# Patient Record
Sex: Female | Born: 1965 | Race: Black or African American | Hispanic: No | Marital: Married | Smoking: Never smoker
Health system: Southern US, Community
[De-identification: ages and names within clinical notes are randomized; demographics above are authoritative.]

## PROBLEM LIST (undated history)

## (undated) DIAGNOSIS — J40 Bronchitis, not specified as acute or chronic: Secondary | ICD-10-CM

## (undated) DIAGNOSIS — J45909 Unspecified asthma, uncomplicated: Secondary | ICD-10-CM

## (undated) DIAGNOSIS — R600 Localized edema: Secondary | ICD-10-CM

## (undated) DIAGNOSIS — E669 Obesity, unspecified: Secondary | ICD-10-CM

## (undated) DIAGNOSIS — E7800 Pure hypercholesterolemia, unspecified: Secondary | ICD-10-CM

## (undated) HISTORY — DX: Unspecified asthma, uncomplicated: J45.909

## (undated) HISTORY — DX: Bronchitis, not specified as acute or chronic: J40

## (undated) HISTORY — DX: Localized edema: R60.0

## (undated) HISTORY — DX: Pure hypercholesterolemia, unspecified: E78.00

## (undated) HISTORY — PX: TUBAL LIGATION: SHX77

---

## 2007-03-08 ENCOUNTER — Ambulatory Visit (HOSPITAL_COMMUNITY): Admission: RE | Admit: 2007-03-08 | Discharge: 2007-03-08 | Payer: Self-pay | Admitting: Gastroenterology

## 2010-07-02 NOTE — Op Note (Signed)
Laura Ryan, Laura Ryan                ACCOUNT NO.:  [id number]   MEDICAL RECORD NO.:  [id number]          PATIENT TYPE:  AMB   LOCATION:  ENDO                         FACILITY:  [facility]   PHYSICIAN:  Anselmo Rod, M.D.  DATE OF BIRTH:  [birth year]   DATE OF PROCEDURE:  03/08/2007  DATE OF DISCHARGE:                               OPERATIVE REPORT   PROCEDURE:  Screening colonoscopy.   ENDOSCOPIST:  Anselmo Rod, M.D.   INSTRUMENT USED:  Pentax video colonoscopy.   INDICATIONS FOR PROCEDURE:  This 45 year old African/American female  with a family history of colon cancer in her mother who died at age 60.  The patient is undergoing a screening colonoscopy to rule out colonic  polyps, masses, etc.   PRE-PROCEDURE PREPARATION:  An informed consent was procured from the  patient.  The patient was fasted for eight hours prior to the procedure  and prepped with a bottle of magnesium citrate and one gallon of  Nulytely on the night prior to the procedure.  The risks and benefits of  the procedure, including a 10% mis-rate of cancer and polyps were  discussed with the patient as well.   PRE-PROCEDURE PHYSICAL EXAMINATION:  VITAL SIGNS:  Stable.  NECK:  Supple.  CHEST:  Clear to auscultation.  HEART:  S1 and S2 regular.  ABDOMEN:  Soft, normal bowel sounds.   DESCRIPTION OF PROCEDURE:  The patient was placed in the left lateral  decubitus position.  Sedated with 100 mcg of Fentanyl and 8.5 mg of  Versed given intravenously in slow incremental doses.  Once the patient  was adequately sedated and maintained on low-flow oxygen and continuous  cardiac monitoring, the Pentax video colonoscope was advanced from the  rectum to the cecum.  The appendiceal orifice and ileocecal valve were  clearly visualized and photographed.  No masses, polyps, erosions,  ulcerations or diverticula were seen.  Retroflexion in the rectum  revealed no abnormalities.  The terminal ileum appeared healthy and  without lesions.   The patient tolerated the procedure well without immediate  complications.   IMPRESSION:  Normal colonoscopy up to the terminal ileum.  No masses,  polyps, erosions, ulcerations or diverticula were noted.   RECOMMENDATIONS:  1. Continue a high fiber diet with liberal fluid intake.  2. Repeat colonoscopy in the next five years, unless the patient has      any abnormal symptoms in the interim, in which case she should      contact the office for further recommendations.  3. Outpatient followup as needs arise in the future.      Anselmo Rod, M.D.  Electronically Signed     JNM/MEDQ  D:  03/08/2007  T:  03/08/2007  Job:  [id number]   cc:   Ernestina Penna, M.D.  Fax: [phone]

## 2013-03-07 ENCOUNTER — Ambulatory Visit (INDEPENDENT_AMBULATORY_CARE_PROVIDER_SITE_OTHER): Admitting: Physician Assistant

## 2013-03-07 ENCOUNTER — Encounter: Payer: Self-pay | Admitting: Physician Assistant

## 2013-03-07 VITALS — BP 136/80 | HR 68 | Temp 98.6°F | Resp 18 | Ht 63.0 in | Wt 217.0 lb

## 2013-03-07 DIAGNOSIS — Z111 Encounter for screening for respiratory tuberculosis: Secondary | ICD-10-CM

## 2013-03-07 DIAGNOSIS — Z23 Encounter for immunization: Secondary | ICD-10-CM

## 2013-03-07 DIAGNOSIS — E559 Vitamin D deficiency, unspecified: Secondary | ICD-10-CM

## 2013-03-07 DIAGNOSIS — Z0000 Encounter for general adult medical examination without abnormal findings: Secondary | ICD-10-CM

## 2013-03-07 DIAGNOSIS — Z0184 Encounter for antibody response examination: Secondary | ICD-10-CM

## 2013-03-07 NOTE — Progress Notes (Signed)
Patient ID: Laura Ryan MRN: [id number], DOB: 1965-07-18, 48 y.o. Date of Encounter: 03/07/2013,   Chief Complaint: Physical (CPE)  HPI: 48 y.o. y/o AA  female  here for CPE.   She has performed at least to be completed for her school. 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 She is retired from the [employer]. She is working part-time at [employer] as well as going to school. She has no complaints today.   Review of Systems: Consitutional: No fever, chills, fatigue, night sweats, lymphadenopathy. No significant/unexplained weight changes. Eyes: No visual changes, eye redness, or discharge. ENT/Mouth: No ear pain, sore throat, nasal drainage, or sinus pain. Cardiovascular: No chest pressure,heaviness, tightness or squeezing, even with exertion. No increased shortness of breath or dyspnea on exertion.No palpitations, edema, orthopnea, PND. Respiratory: No cough, hemoptysis, SOB, or wheezing. Gastrointestinal: No anorexia, dysphagia, reflux, pain, nausea, vomiting, hematemesis, diarrhea, constipation, BRBPR, or melena. Breast: No mass, nodules, bulging, or retraction. No skin changes or inflammation. No nipple discharge. No lymphadenopathy. Genitourinary: No dysuria, hematuria, incontinence, vaginal discharge, pruritis, burning, abnormal bleeding, or pain. Musculoskeletal: No decreased ROM, No joint pain or swelling. No significant pain in neck, back, or extremities. Skin: No rash, pruritis, or concerning lesions. Neurological: No headache, dizziness, syncope, seizures, tremors, memory loss, coordination problems, or paresthesias. Psychological: No anxiety, depression, hallucinations, SI/HI. Endocrine: No polydipsia, polyphagia, polyuria, or known diabetes.No increased fatigue. No palpitations/rapid heart rate. No significant/unexplained weight change. All other systems were reviewed and are otherwise negative.  History reviewed. No pertinent past medical history.    History reviewed. No pertinent past surgical history.  Home Meds:  No current outpatient prescriptions on file prior to visit.   No current facility-administered medications on file prior to visit.    Allergies: No Known Allergies  History   Social History  . Marital Status: Married    Spouse Name: N/A    Number of Children: N/A  . Years of Education: N/A   Occupational History  . Not on file.   Social History Main Topics  . Smoking status: Never Smoker   . Smokeless tobacco: Never Used  . Alcohol Use: No  . Drug Use: No  . Sexual Activity: Not on file   Other Topics Concern  . Not on file   Social History Narrative  . No narrative on file   She is married with 3 children. However they're all out of the house. He says that 2 of the children are in college. And one of them is trying to "find himself ". She is retired from the [employer]. She works part-time at [employer]. 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  Family history he was entered into the computer system at the start of my note. Is significant for her mother being diagnosed with colon cancer at age 29 and dying at age 38.  Physical Exam: Blood pressure 136/80, pulse 68, temperature 98.6 F (37 C), temperature source Oral, resp. rate 18, height 5\' 3"  (1.6 m), weight 217 lb (98.431 kg), last menstrual period 02/03/2013., Body mass index is 38.45 kg/(m^2). General: Obese AAF. Appears in no acute distress. HEENT: Normocephalic, atraumatic. Conjunctiva pink, sclera non-icteric. Pupils 2 mm constricting to 1 mm, round, regular, and equally reactive to light and accomodation. EOMI. Internal auditory canal clear. TMs with good cone of light and without pathology. Nasal mucosa pink. Nares are without discharge. No sinus tenderness. Oral mucosa pink.  Pharynx without exudate.  Neck: Supple. Trachea midline. No thyromegaly. Full ROM. No lymphadenopathy.No Carotid Bruits. Lungs: Clear to auscultation  bilaterally without wheezes, rales, or rhonchi. Breathing is of normal effort and unlabored. Cardiovascular: RRR with S1 S2. No murmurs, rubs, or gallops. Distal pulses 2+ symmetrically. No carotid or abdominal bruits. Breast: Symmetrical. No masses. Nipples without discharge. Abdomen: Soft, non-tender, non-distended with normoactive bowel sounds. No hepatosplenomegaly or masses. No rebound/guarding. No CVA tenderness. No hernias.  Genitourinary:  External genitalia without lesions. Vaginal mucosa pink.No discharge present. Cervix pink and without discharge. No cervical tenderness.Normal uterus size. No adnexal mass or tenderness.   Musculoskeletal: Full range of motion and 5/5 strength throughout. Without swelling, atrophy, tenderness, crepitus, or warmth. Extremities without clubbing, cyanosis, or edema. Calves supple. Skin: Warm and moist without erythema, ecchymosis, wounds, or rash. Neuro: A+Ox3. CN II-XII grossly intact. Moves all extremities spontaneously. Full sensation throughout. Normal gait. DTR 2+ throughout upper and lower extremities. Finger to nose intact. Psych:  Responds to questions appropriately with a normal affect.   Assessment/Plan:  48 y.o. y/o female here for CPE 1. Visit for preventive health examination  A. Screening Labs: She is not fasting. She had a full set of labs done with her physical 08/31/09. All labs were normal except for low vitamin D. She is having no symptoms to suggest new lab abnormality. We'll wait to repeat full set of labs.  B. Pap: Last Pap was 08/31/2009. Normal. Can wait 5 years to repeat this will be due 08/2014. Pelvic exam is normal.  C. Screening Mammogram: Last mammogram report on file is 09/26/2009. Patient states that this is the last mammogram she has had. It was negative. Today I did write down the name of the mammogram facility as well as the phone number and she is to go home and call to schedule followup mammogram.  D. DEXA/BMD: Can  wait to schedule this in the future.  E. Colorectal Cancer Screening: Premature family history of colon cancer. Her mother was diagnosed with colon cancer at age 48 and died at age 48. Strongly encouraged her to schedule followup for her colonoscopy immediately.  Last colonoscopy was January 2009. Repeat 5 years. I discussed with her that this is overdue. On her AVS today, I have written down Dr. Kenna GilbertMann's phone number and she is to call Dr. Kenna GilbertMann's office to schedule followup.  F. Immunizations:  Influenza: Influenza vaccine given today Tetanus: Tdap Given today Pneumococcal: Not indicated until age 48 Zostavax: We'll discuss at age 48   2. Vitamin D deficiency At her last lab July 2011 she had vitamin D deficiency. She reports that she did take that prescription vitamin D weekly for 3 months. However, when she completed the prescription she did not take any over-the-counter vitamin D. I recommended she start taking over-the-counter vitamin D 2000 units daily.  3. PPD screening test--needed for her form for her schooling. - PPD  4. Need for prophylactic vaccination and inoculation against influenza - Flu Vaccine QUAD 36+ mos PF IM (Fluarix)  5. Need for Tdap vaccination - Tdap vaccine greater than or equal to 7yo IM  6. Antibody response examination Varicella titer as needed for her form for school. - Varicella zoster antibody, IgG  Signed, [address]Mary Beth Livingston ManorDixon, [place], [facility] 03/07/2013 1:34 PM

## 2013-03-08 LAB — VARICELLA ZOSTER ANTIBODY, IGG: VARICELLA IGG: 645.7 {index} — AB (ref <135.00)

## 2013-03-09 ENCOUNTER — Ambulatory Visit: Admitting: Family Medicine

## 2013-03-09 DIAGNOSIS — Z111 Encounter for screening for respiratory tuberculosis: Secondary | ICD-10-CM

## 2013-03-09 LAB — TB SKIN TEST
INDURATION: 0 mm
TB Skin Test: NEGATIVE

## 2013-03-09 NOTE — Progress Notes (Signed)
Patient ID: Laura RummageSheila Ryan, female   DOB: 01/24/1966, 48 y.o.   MRN: [id number] Pt had PPD TB skin test applied on Monday.  Returns today for reading.  Site is clear of any reaction.

## 2013-03-15 ENCOUNTER — Ambulatory Visit (INDEPENDENT_AMBULATORY_CARE_PROVIDER_SITE_OTHER): Admitting: Family Medicine

## 2013-03-15 DIAGNOSIS — Z111 Encounter for screening for respiratory tuberculosis: Secondary | ICD-10-CM

## 2013-03-17 ENCOUNTER — Ambulatory Visit: Admitting: *Deleted

## 2013-03-17 DIAGNOSIS — Z111 Encounter for screening for respiratory tuberculosis: Secondary | ICD-10-CM

## 2013-03-17 LAB — TB SKIN TEST
Induration: 0 mm
TB SKIN TEST: NEGATIVE

## 2013-04-04 ENCOUNTER — Encounter: Payer: Self-pay | Admitting: Family Medicine

## 2014-01-27 ENCOUNTER — Emergency Department (HOSPITAL_COMMUNITY)
Admission: EM | Admit: 2014-01-27 | Discharge: 2014-01-28 | Disposition: A | Discharge status: Home / Self Care | Attending: Emergency Medicine | Admitting: Emergency Medicine

## 2014-01-27 ENCOUNTER — Encounter (HOSPITAL_COMMUNITY): Payer: Self-pay | Admitting: Emergency Medicine

## 2014-01-27 DIAGNOSIS — S39012A Strain of muscle, fascia and tendon of lower back, initial encounter: Secondary | ICD-10-CM

## 2014-01-27 DIAGNOSIS — Z3202 Encounter for pregnancy test, result negative: Secondary | ICD-10-CM | POA: Insufficient documentation

## 2014-01-27 DIAGNOSIS — E669 Obesity, unspecified: Secondary | ICD-10-CM | POA: Diagnosis not present

## 2014-01-27 DIAGNOSIS — X58XXXA Exposure to other specified factors, initial encounter: Secondary | ICD-10-CM | POA: Insufficient documentation

## 2014-01-27 DIAGNOSIS — Y998 Other external cause status: Secondary | ICD-10-CM | POA: Insufficient documentation

## 2014-01-27 DIAGNOSIS — Y93E5 Activity, floor mopping and cleaning: Secondary | ICD-10-CM | POA: Diagnosis not present

## 2014-01-27 DIAGNOSIS — Z79899 Other long term (current) drug therapy: Secondary | ICD-10-CM | POA: Insufficient documentation

## 2014-01-27 DIAGNOSIS — Y92019 Unspecified place in single-family (private) house as the place of occurrence of the external cause: Secondary | ICD-10-CM | POA: Insufficient documentation

## 2014-01-27 DIAGNOSIS — R55 Syncope and collapse: Secondary | ICD-10-CM | POA: Insufficient documentation

## 2014-01-27 HISTORY — DX: Obesity, unspecified: E66.9

## 2014-01-27 LAB — BASIC METABOLIC PANEL
Anion gap: 14 (ref 5–15)
BUN: 13 mg/dL (ref 6–23)
CALCIUM: 9.4 mg/dL (ref 8.4–10.5)
CO2: 27 meq/L (ref 19–32)
CREATININE: 1.04 mg/dL (ref 0.50–1.10)
Chloride: 98 mEq/L (ref 96–112)
GFR calc Af Amer: 72 mL/min — ABNORMAL LOW (ref >90)
GFR, EST NON AFRICAN AMERICAN: 62 mL/min — AB (ref >90)
GLUCOSE: 95 mg/dL (ref 70–99)
Potassium: 4.2 mEq/L (ref 3.7–5.3)
Sodium: 139 mEq/L (ref 137–147)

## 2014-01-27 LAB — CBC
HCT: 33.6 % — ABNORMAL LOW (ref 36.0–46.0)
Hemoglobin: 11.3 g/dL — ABNORMAL LOW (ref 12.0–15.0)
MCH: 30.1 pg (ref 26.0–34.0)
MCHC: 33.6 g/dL (ref 30.0–36.0)
MCV: 89.4 fL (ref 78.0–100.0)
Platelets: 220 10*3/uL (ref 150–400)
RBC: 3.76 MIL/uL — ABNORMAL LOW (ref 3.87–5.11)
RDW: 13.7 % (ref 11.5–15.5)
WBC: 8.7 10*3/uL (ref 4.0–10.5)

## 2014-01-27 NOTE — ED Notes (Signed)
Pt reports that she was having lower back pain radiating to her L leg last night, became unable to walk on leg d/t pain. Pt felt the urge to urinate at 0330 this morning and stood up to go to the BR and "then I came to on the floor and had urinated on myself." Pt reports taking Phentermine for weight loss, felt flushed and sweaty before LOC. Denies any CP or ShOB. Pt NSR on monitor at this time. 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

## 2014-01-28 LAB — URINALYSIS, ROUTINE W REFLEX MICROSCOPIC
Bilirubin Urine: NEGATIVE
GLUCOSE, UA: NEGATIVE mg/dL
HGB URINE DIPSTICK: NEGATIVE
Ketones, ur: NEGATIVE mg/dL
Leukocytes, UA: NEGATIVE
Nitrite: NEGATIVE
Protein, ur: NEGATIVE mg/dL
Specific Gravity, Urine: 1.025 (ref 1.005–1.030)
Urobilinogen, UA: 0.2 mg/dL (ref 0.0–1.0)
pH: 5.5 (ref 5.0–8.0)

## 2014-01-28 LAB — TROPONIN I: Troponin I: <0.3 ng/mL (ref <0.30)

## 2014-01-28 LAB — CBG MONITORING, ED: Glucose-Capillary: 94 mg/dL (ref 70–99)

## 2014-01-28 LAB — PREGNANCY, URINE: PREG TEST UR: NEGATIVE

## 2014-01-28 MED ORDER — IBUPROFEN 600 MG PO TABS: 600.0000 mg | ORAL_TABLET | Freq: Four times a day (QID) | ORAL | Status: DC | PRN

## 2014-01-28 MED ORDER — METHOCARBAMOL 500 MG PO TABS: 500.0000 mg | ORAL_TABLET | Freq: Two times a day (BID) | ORAL | Status: DC

## 2014-01-28 NOTE — Discharge Instructions (Signed)
Discontinue taking diet supplements. Recommend ibuprofen and Robaxin as prescribed for back pain. Follow-up with your primary care doctor regarding your visit to the emergency department today. Return as needed if symptoms worsen.  Vasovagal Syncope, Adult Syncope, commonly known as fainting, is a temporary loss of consciousness. It occurs when the blood flow to the brain is reduced. Vasovagal syncope (also called neurocardiogenic syncope) is a fainting spell in which the blood flow to the brain is reduced because of a sudden drop in heart rate and blood pressure. Vasovagal syncope occurs when the brain and the cardiovascular system (blood vessels) do not adequately communicate and respond to each other. This is the most common cause of fainting. It often occurs in response to fear or some other type of emotional or physical stress. The body has a reaction in which the heart starts beating too slowly or the blood vessels expand, reducing blood pressure. This type of fainting spell is generally considered harmless. However, injuries can occur if a person takes a sudden fall during a fainting spell.  CAUSES  Vasovagal syncope occurs when a person's blood pressure and heart rate decrease suddenly, usually in response to a trigger. Many things and situations can trigger an episode. Some of these include:   Pain.   Fear.   The sight of blood or medical procedures, such as blood being drawn from a vein.   Common activities, such as coughing, swallowing, stretching, or going to the bathroom.   Emotional stress.   Prolonged standing, especially in a warm environment.   Lack of sleep or rest.   Prolonged lack of food.   Prolonged lack of fluids.   Recent illness.  The use of certain drugs that affect blood pressure, such as cocaine, alcohol, marijuana, inhalants, and opiates.  SYMPTOMS  Before the fainting episode, you may:   Feel dizzy or light headed.   Become pale.  Sense that  you are going to faint.   Feel like the room is spinning.   Have tunnel vision, only seeing directly in front of you.   Feel sick to your stomach (nauseous).   See spots or slowly lose vision.   Hear ringing in your ears.   Have a headache.   Feel warm and sweaty.   Feel a sensation of pins and needles. During the fainting spell, you will generally be unconscious for no longer than a couple minutes before waking up and returning to normal. If you get up too quickly before your body can recover, you may faint again. Some twitching or jerky movements may occur during the fainting spell.  DIAGNOSIS  Your caregiver will ask about your symptoms, take a medical history, and perform a physical exam. Various tests may be done to rule out other causes of fainting. These may include blood tests and tests to check the heart, such as electrocardiography, echocardiography, and possibly an electrophysiology study. When other causes have been ruled out, a test may be done to check the body's response to changes in position (tilt table test). TREATMENT  Most cases of vasovagal syncope do not require treatment. Your caregiver may recommend ways to avoid fainting triggers and may provide home strategies for preventing fainting. If you must be exposed to a possible trigger, you can drink additional fluids to help reduce your chances of having an episode of vasovagal syncope. If you have warning signs of an oncoming episode, you can respond by positioning yourself favorably (lying down). If your fainting spells continue, you may  be given medicines to prevent fainting. Some medicines may help make you more resistant to repeated episodes of vasovagal syncope. Special exercises or compression stockings may be recommended. In rare cases, the surgical placement of a pacemaker is considered. HOME CARE INSTRUCTIONS   Learn to identify the warning signs of vasovagal syncope.   Sit or lie down at the first  warning sign of a fainting spell. If sitting, put your head down between your legs. If you lie down, swing your legs up in the air to increase blood flow to the brain.   Avoid hot tubs and saunas.  Avoid prolonged standing.  Drink enough fluids to keep your urine clear or pale yellow. Avoid caffeine.  Increase salt in your diet as directed by your caregiver.   If you have to stand for a long time, perform movements such as:   Crossing your legs.   Flexing and stretching your leg muscles.   Squatting.   Moving your legs.   Bending over.   Only take over-the-counter or prescription medicines as directed by your caregiver. Do not suddenly stop any medicines without asking your caregiver first. SEEK MEDICAL CARE IF:   Your fainting spells continue or happen more frequently in spite of treatment.   You lose consciousness for more than a couple minutes.  You have fainting spells during or after exercising or after being startled.   You have new symptoms that occur with the fainting spells, such as:   Shortness of breath.  Chest pain.   Irregular heartbeat.   You have episodes of twitching or jerky movements that last longer than a few seconds.  You have episodes of twitching or jerky movements without obvious fainting. SEEK IMMEDIATE MEDICAL CARE IF:   You have injuries or bleeding after a fainting spell.   You have episodes of twitching or jerky movements that last longer than 5 minutes.   You have more than one spell of twitching or jerky movements before returning to consciousness after fainting. MAKE SURE YOU:   Understand these instructions.  Will watch your condition.  Will get help right away if you are not doing well or get worse. Document Released: 01/21/2012 Document Reviewed: 01/21/2012 [facility] Patient Information 2015 [place]. This information is not intended to replace advice given to you by your health care provider. Make sure  you discuss any questions you have with your health care provider.

## 2014-01-28 NOTE — ED Provider Notes (Signed)
CSN: [id number]     Arrival date & time 01/27/14  2200 History   First MD Initiated Contact with Patient 01/28/14 0055     Chief Complaint  Patient presents with  . Loss of Consciousness  . Back Pain     (Consider location/radiation/quality/duration/timing/severity/associated sxs/prior Treatment) HPI Comments: Patient is a 48 year old female with no significant past medical history who presents to the emergency department for further evaluation of syncope. Patient states that she was cleaning her house 2 days ago when she began to notice spasms in her low back. These spasms were intermittent and radiated down her left leg. Patient states that she took some Tylenol with some mild improvement to her symptoms. She states that she went about her day normally. She states that she woke up at 0300 yesterday morning secondary to an urge to urinate. Shortly after waking she began to feel lightheaded as well as dizzy and diaphoretic. She states that the next thing she knew, she was on the ground face forward. She had one episode of incontinence during this syncopal episode. Patient states that she has been feeling at baseline since this incident. She has had no complaints of headache, chest pain, shortness of breath, vomiting, diarrhea, fever, hemoptysis, extremity numbness/paresthesias, extremity weakness, genital numbness, or bowel/bladder incontinence. No associated leg swelling, recent surgeries or hospitalizations, or recent travel. She states that she has been taking Phentermine for weight loss x 6 days. No hx of HTN, DM, HLD, ACS or FHx of ACS.   Patient is a 48 y.o. female presenting with syncope and back pain. The history is provided by the patient. No language interpreter was used.  Loss of Consciousness Back Pain   Past Medical History  Diagnosis Date  . Obese    History reviewed. No pertinent past surgical history. Family History  Problem Relation Age of Onset  . Cancer Mother 6845   Colon Cancer  . Alcohol abuse Father    History  Substance Use Topics  . Smoking status: Never Smoker   . Smokeless tobacco: Never Used  . Alcohol Use: Yes     Comment: socially   OB History    No data available      Review of Systems  Cardiovascular: Positive for syncope.  Musculoskeletal: Positive for back pain.  Neurological: Positive for syncope.  All other systems reviewed and are negative.   Allergies  Review of patient's allergies indicates no known allergies.  Home Medications   Prior to Admission medications   Medication Sig Start Date End Date Taking? Authorizing Provider  Multiple Vitamin (MULTIVITAMIN) tablet Take 1 tablet by mouth daily.   Yes Historical Provider, MD  phentermine (ADIPEX-P) 37.5 MG tablet Take 37.5 mg by mouth daily before breakfast.   Yes Historical Provider, MD  B-12, METHYLCOBALAMIN, SL Place 1 tablet under the tongue daily.    Historical Provider, MD  BIOTIN PO Take 1 tablet by mouth daily.    Historical Provider, MD  ibuprofen (ADVIL,MOTRIN) 600 MG tablet Take 1 tablet (600 mg total) by mouth every 6 (six) hours as needed. 01/28/14   Antony MaduraKelly Nysha Koplin, PA-C  methocarbamol (ROBAXIN) 500 MG tablet Take 1 tablet (500 mg total) by mouth 2 (two) times daily. 01/28/14   Antony MaduraKelly Salvatore Shear, PA-C   BP 139/70 mmHg  Pulse 74  Temp(Src) 98.5 F (36.9 C) (Oral)  Resp 16  SpO2 99%  LMP 01/01/2014   Physical Exam  Constitutional: She is oriented to person, place, and time. She appears well-developed and well-nourished.  No distress.  Nontoxic/nonseptic appearing  HENT:  Head: Normocephalic and atraumatic.  Mouth/Throat: Oropharynx is clear and moist. No oropharyngeal exudate.  Eyes: Conjunctivae and EOM are normal. Pupils are equal, round, and reactive to light. No scleral icterus.  Neck: Normal range of motion.  Cardiovascular: Normal rate, regular rhythm, normal heart sounds and intact distal pulses.   Pulmonary/Chest: Effort normal and breath sounds  normal. No respiratory distress. She has no wheezes. She has no rales.  Respirations even and unlabored  Abdominal: Soft. She exhibits no distension. There is no tenderness.  Soft, nontender  Musculoskeletal: Normal range of motion. She exhibits tenderness.  Tenderness to palpation of left lumbar paraspinal muscles. No lumbar midline tenderness, bony deformities, step-offs, or crepitus.  Neurological: She is alert and oriented to person, place, and time. No cranial nerve deficit. She exhibits normal muscle tone. Coordination normal.  GCS 15. Speech is goal oriented. No focal neurologic deficits appreciated. Patient moves extremities without ataxia.  Skin: Skin is warm and dry. No rash noted. She is not diaphoretic. No erythema. No pallor.  Psychiatric: She has a normal mood and affect. Her behavior is normal.  Nursing note and vitals reviewed.   ED Course  Procedures (including critical care time) Labs Review Labs Reviewed  BASIC METABOLIC PANEL - Abnormal; Notable for the following:    GFR calc non Af Amer 62 (*)    GFR calc Af Amer 72 (*)    All other components within normal limits  CBC - Abnormal; Notable for the following:    RBC 3.76 (*)    Hemoglobin 11.3 (*)    HCT 33.6 (*)    All other components within normal limits  TROPONIN I  URINALYSIS, ROUTINE W REFLEX MICROSCOPIC  PREGNANCY, URINE  CBG MONITORING, ED    Imaging Review No results found.   EKG Interpretation   Date/Time:  Saturday January 28 2014 00:46:22 EST Ventricular Rate:  76 PR Interval:  152 QRS Duration: 82 QT Interval:  407 QTC Calculation: 458 R Axis:   57 Text Interpretation:  Sinus rhythm Confirmed by [facility]  MD, Morene Antu  ([id number]) on 01/28/2014 12:49:06 AM      MDM   Final diagnoses:  Vasovagal syncope  Low back strain, initial encounter    48 year old female presents to the emergency department for further evaluation of syncope. Syncope occurred approximately 24 hours ago and  patient has been feeling at baseline since this time. No shortness of breath associated with her symptoms. She does complain of some back spasms in her left low back with notable tenderness to palpation to her left lumbar paraspinal muscles. No tenderness to the midline, bony deformities, step-offs, or crepitus. No red flags or signs concerning for cauda equina. Patient has had no persistent incontinence; normal urination and BMs throughout the day today. No perianal numbness or genital anesthesia. I do not believe the patient's incontinence during her syncopal episode is related to her back pain. Back pain is, rather, c/w muscle strain and spasm.  Patient today is low risk for emergent cause of her syncope. She is not orthostatic. She is at low risk for serious outcome per her [facility] Syncope score and I feel she can be safely discharged from the hospital at this time with instruction to follow up with her PCP. Suspect vasovagal syncope. I have advised the patient to discontinue taking Phentermine as this may have contributed to her syncopal episode. Ibuprofen and robaxin advised for back pain and return precautions discussed. Patient  agreeable to plan with no unaddressed concerns. Patient discharged in good condition; VSS.   Filed Vitals:   01/27/14 2212 01/28/14 0111 01/28/14 0253  BP: 136/77 124/50 139/70  Pulse: 88 77 74  Temp: 98.5 F (36.9 C)    TempSrc: Oral  Oral  Resp: 20 18 16   SpO2: 99% 97% 99%     Antony MaduraKelly Zymier Rodgers, PA-C 01/28/14 [id number]  April K Palumbo-Rasch, MD 01/28/14 [phone]

## 2015-05-15 ENCOUNTER — Emergency Department (HOSPITAL_COMMUNITY)

## 2015-05-15 ENCOUNTER — Emergency Department (HOSPITAL_COMMUNITY)
Admission: EM | Admit: 2015-05-15 | Discharge: 2015-05-15 | Disposition: A | Discharge status: Home / Self Care | Attending: Emergency Medicine | Admitting: Emergency Medicine

## 2015-05-15 ENCOUNTER — Encounter (HOSPITAL_COMMUNITY): Payer: Self-pay | Admitting: Emergency Medicine

## 2015-05-15 DIAGNOSIS — R05 Cough: Secondary | ICD-10-CM | POA: Diagnosis present

## 2015-05-15 DIAGNOSIS — J40 Bronchitis, not specified as acute or chronic: Secondary | ICD-10-CM | POA: Diagnosis not present

## 2015-05-15 DIAGNOSIS — R0989 Other specified symptoms and signs involving the circulatory and respiratory systems: Secondary | ICD-10-CM

## 2015-05-15 DIAGNOSIS — E669 Obesity, unspecified: Secondary | ICD-10-CM | POA: Insufficient documentation

## 2015-05-15 DIAGNOSIS — Z79899 Other long term (current) drug therapy: Secondary | ICD-10-CM | POA: Diagnosis not present

## 2015-05-15 DIAGNOSIS — R053 Chronic cough: Secondary | ICD-10-CM

## 2015-05-15 LAB — CBC WITH DIFFERENTIAL/PLATELET
BASOS ABS: 0 10*3/uL (ref 0.0–0.1)
BASOS PCT: 0 %
Eosinophils Absolute: 0.3 10*3/uL (ref 0.0–0.7)
Eosinophils Relative: 4 %
HCT: 35.1 % — ABNORMAL LOW (ref 36.0–46.0)
Hemoglobin: 11.6 g/dL — ABNORMAL LOW (ref 12.0–15.0)
Lymphocytes Relative: 21 %
Lymphs Abs: 1.8 10*3/uL (ref 0.7–4.0)
MCH: 29.1 pg (ref 26.0–34.0)
MCHC: 33 g/dL (ref 30.0–36.0)
MCV: 88.2 fL (ref 78.0–100.0)
Monocytes Absolute: 0.5 10*3/uL (ref 0.1–1.0)
Monocytes Relative: 6 %
NEUTROS ABS: 6.2 10*3/uL (ref 1.7–7.7)
Neutrophils Relative %: 69 %
PLATELETS: 197 10*3/uL (ref 150–400)
RBC: 3.98 MIL/uL (ref 3.87–5.11)
RDW: 14.3 % (ref 11.5–15.5)
WBC: 8.8 10*3/uL (ref 4.0–10.5)

## 2015-05-15 LAB — BASIC METABOLIC PANEL
ANION GAP: 9 (ref 5–15)
BUN: 11 mg/dL (ref 6–20)
CALCIUM: 9.3 mg/dL (ref 8.9–10.3)
CO2: 25 mmol/L (ref 22–32)
Chloride: 105 mmol/L (ref 101–111)
Creatinine, Ser: 0.93 mg/dL (ref 0.44–1.00)
GFR calc Af Amer: >60 mL/min (ref >60)
GLUCOSE: 99 mg/dL (ref 65–99)
POTASSIUM: 3.6 mmol/L (ref 3.5–5.1)
Sodium: 139 mmol/L (ref 135–145)

## 2015-05-15 MED ORDER — PREDNISONE 20 MG PO TABS: 60.0000 mg | ORAL_TABLET | Freq: Every day | ORAL | Status: DC

## 2015-05-15 MED ORDER — ALBUTEROL SULFATE (2.5 MG/3ML) 0.083% IN NEBU
5.0000 mg | INHALATION_SOLUTION | Freq: Once | RESPIRATORY_TRACT | Status: AC
  Administered 2015-05-15: 5 mg via RESPIRATORY_TRACT
  Filled 2015-05-15: qty 6

## 2015-05-15 MED ORDER — PREDNISONE 20 MG PO TABS
60.0000 mg | ORAL_TABLET | Freq: Once | ORAL | Status: AC
  Administered 2015-05-15: 60 mg via ORAL
  Filled 2015-05-15: qty 3

## 2015-05-15 MED ORDER — ALBUTEROL SULFATE HFA 108 (90 BASE) MCG/ACT IN AERS
2.0000 | INHALATION_SPRAY | RESPIRATORY_TRACT | Status: DC | PRN
  Administered 2015-05-15: 2 via RESPIRATORY_TRACT
  Filled 2015-05-15: qty 6.7

## 2015-05-15 NOTE — Discharge Instructions (Signed)

## 2015-05-15 NOTE — ED Provider Notes (Signed)
CSN: [id number]     Arrival date & time 05/15/15  0147 History   First MD Initiated Contact with Patient 05/15/15 0600     Chief Complaint  Patient presents with  . Cough     (Consider location/radiation/quality/duration/timing/severity/associated sxs/prior Treatment) HPI Comments: Patient presents to the ER for evaluation of cough and chest congestion. Patient reports that the symptoms have been ongoing for 3 days. She has had nasal congestion and has felt weakened. She has not had any fever or chills. Patient reports a history of bronchitis with asthma type symptoms in the past. She is not expressing any chest pain.  Patient is a 50 y.o. female presenting with cough.  Cough Associated symptoms: shortness of breath     Past Medical History  Diagnosis Date  . Obese    History reviewed. No pertinent past surgical history. Family History  Problem Relation Age of Onset  . Cancer Mother 25    Colon Cancer  . Alcohol abuse Father    Social History  Substance Use Topics  . Smoking status: Never Smoker   . Smokeless tobacco: Never Used  . Alcohol Use: Yes     Comment: socially   OB History    No data available     Review of Systems  HENT: Positive for congestion.   Respiratory: Positive for cough and shortness of breath.   All other systems reviewed and are negative.     Allergies  Review of patient's allergies indicates no known allergies.  Home Medications   Prior to Admission medications   Medication Sig Start Date End Date Taking? Authorizing Provider  B-12, METHYLCOBALAMIN, SL Place 1 tablet under the tongue daily.    Historical Provider, MD  BIOTIN PO Take 1 tablet by mouth daily.    Historical Provider, MD  ibuprofen (ADVIL,MOTRIN) 600 MG tablet Take 1 tablet (600 mg total) by mouth every 6 (six) hours as needed. 01/28/14   Antony Madura, PA-C  methocarbamol (ROBAXIN) 500 MG tablet Take 1 tablet (500 mg total) by mouth 2 (two) times daily. 01/28/14   Antony Madura, PA-C  Multiple Vitamin (MULTIVITAMIN) tablet Take 1 tablet by mouth daily.    Historical Provider, MD  phentermine (ADIPEX-P) 37.5 MG tablet Take 37.5 mg by mouth daily before breakfast.    Historical Provider, MD   BP 151/85 mmHg  Pulse 85  Temp(Src) 98.6 F (37 C) (Oral)  Resp 18  SpO2 97%  LMP 05/13/2015 Physical Exam  Constitutional: She is oriented to person, place, and time. She appears well-developed and well-nourished. No distress.  HENT:  Head: Normocephalic and atraumatic.  Right Ear: Hearing normal.  Left Ear: Hearing normal.  Nose: Nose normal.  Mouth/Throat: Oropharynx is clear and moist and mucous membranes are normal.  Eyes: Conjunctivae and EOM are normal. Pupils are equal, round, and reactive to light.  Neck: Normal range of motion. Neck supple.  Cardiovascular: Regular rhythm, S1 normal and S2 normal.  Exam reveals no gallop and no friction rub.   No murmur heard. Pulmonary/Chest: Effort normal. No respiratory distress. She has wheezes in the right lower field and the left lower field. She exhibits no tenderness.  Abdominal: Soft. Normal appearance and bowel sounds are normal. There is no hepatosplenomegaly. There is no tenderness. There is no rebound, no guarding, no tenderness at McBurney's point and negative Murphy's sign. No hernia.  Musculoskeletal: Normal range of motion.  Neurological: She is alert and oriented to person, place, and time. She has normal strength.  No cranial nerve deficit or sensory deficit. Coordination normal. GCS eye subscore is 4. GCS verbal subscore is 5. GCS motor subscore is 6.  Skin: Skin is warm, dry and intact. No rash noted. No cyanosis.  Psychiatric: She has a normal mood and affect. Her speech is normal and behavior is normal. Thought content normal.  Nursing note and vitals reviewed.   ED Course  Procedures (including critical care time) Labs Review Labs Reviewed  CBC WITH DIFFERENTIAL/PLATELET - Abnormal; Notable for  the following:    Hemoglobin 11.6 (*)    HCT 35.1 (*)    All other components within normal limits  BASIC METABOLIC PANEL    Imaging Review Dg Chest 2 View  05/15/2015  CLINICAL DATA:  50 year old female with persistent cough EXAM: CHEST  2 VIEW COMPARISON:  None. FINDINGS: The heart size and mediastinal contours are within normal limits. Both lungs are clear. The visualized skeletal structures are unremarkable. IMPRESSION: No active cardiopulmonary disease. Electronically Signed   By: Elgie CollardArash  Radparvar M.D.   On: 05/15/2015 02:09   I have personally reviewed and evaluated these images and lab results as part of my medical decision-making.   EKG Interpretation None      MDM   Final diagnoses:  Bronchitis    Patient presents to the ER for evaluation of several days of persistent cough with mild wheezing and shortness of breath. Patient does not have a history of asthma or COPD, but does report that she has had similar symptoms in the past with bronchitis. Chest x-ray does not show any evidence of pneumonia. Patient did have mild wheezing here that was treated with bronchodilator therapy and prednisone. She will continue treatment as an outpatient.    Gilda Creasehristopher J Braden Cimo, MD 05/15/15 [phone]

## 2015-05-15 NOTE — ED Notes (Signed)
Pt. reports productive cough with chest congestion , fatigue and mild nasal congestion onset Saturday , denies fever or chills.

## 2016-03-07 ENCOUNTER — Encounter: Payer: Self-pay | Admitting: Family Medicine

## 2016-03-07 ENCOUNTER — Ambulatory Visit (INDEPENDENT_AMBULATORY_CARE_PROVIDER_SITE_OTHER): Admitting: Family Medicine

## 2016-03-07 VITALS — BP 128/64 | HR 90 | Temp 99.2°F | Resp 16 | Ht 63.0 in | Wt 248.0 lb

## 2016-03-07 DIAGNOSIS — B349 Viral infection, unspecified: Secondary | ICD-10-CM

## 2016-03-07 LAB — INFLUENZA A AND B AG, IMMUNOASSAY
INFLUENZA A ANTIGEN: NOT DETECTED
INFLUENZA B ANTIGEN: NOT DETECTED

## 2016-03-07 MED ORDER — AZITHROMYCIN 250 MG PO TABS: ORAL_TABLET | ORAL | 0 refills | Status: DC

## 2016-03-07 MED ORDER — GUAIFENESIN-CODEINE 100-10 MG/5ML PO SOLN: 5.0000 mL | Freq: Four times a day (QID) | ORAL | 0 refills | Status: DC | PRN

## 2016-03-07 NOTE — Progress Notes (Signed)
   Subjective:    Patient ID: Laura Ryan, female    DOB: 10/30/1965, 51 y.o.   MRN: [id number]  Patient presents for Illness (x2 days- fever/ chills, diarrhea, chest congestion, productive cough with brownish mucus, no appetite)  Patient here with 2 days of fever chills chest congestion productive cough. Yesterday she had diarrhea 2 and this morning no blood in the stool. [place] did [place] and states that she was not sick while she was there. She is taking over-the-counter cough medicine for fever and chest congestion. She did not have her flu shot. She has had some mild wheezing has history of asthma with pressure treated for bronchitis earlier this year by the emergency room she does have an albuterol inhaler at home. Works in [employer]  Review Of Systems:  GEN- denies fatigue,+ fever, weight loss,weakness, recent illness HEENT- denies eye drainage, change in vision, +nasal discharge, CVS- denies chest pain, palpitations RESP- denies SOB, +cough, +wheeze ABD- denies N/V, change in stools, +abd pain GU- denies dysuria, hematuria, dribbling, incontinence MSK- denies joint pain, muscle aches, injury Neuro- denies headache, dizziness, syncope, seizure activity       Objective:    BP 128/64 (BP Location: Left Arm, Patient Position: Sitting, Cuff Size: Large)   Pulse 90   Temp 99.2 F (37.3 C) (Oral)   Resp 16   Ht 5\' 3"  (1.6 m)   Wt 248 lb (112.5 kg)   SpO2 98%   BMI 43.93 kg/m  GEN- NAD, alert and oriented x3,febrile  HEENT- PERRL, EOMI, non injected sclera, pink conjunctiva, MMM, oropharynx mild injection no exudates, No maxillary sinus tenderness, TM clear canals clear  Neck- Supple, NO lad CVS- RRR, no murmur RESP-CTAB ABD-NABS,soft,NT,ND EXT- No edema Pulses- Radial  2+        Assessment & Plan:      Problem List Items Addressed This Visit    None    Visit Diagnoses    Viral illness    -  Primary   Neg flu, push fluids, robitussin AC, can  use mucinex in AM cough syrup at bedtime. Given zpak in case she does not improve but should wait another 3 days   Relevant Medications   azithromycin (ZITHROMAX) 250 MG tablet   Other Relevant Orders   Influenza A and B Ag, Immunoassay      Note: This dictation was prepared with Dragon dictation along with smaller phrase technology. Any transcriptional errors that result from this process are unintentional.

## 2016-03-07 NOTE — Patient Instructions (Addendum)
Take robitussin codiene Use inhaler as prescribed If not improved by Sunday, start zpak  F/u AS NEEDED

## 2016-06-27 ENCOUNTER — Ambulatory Visit (INDEPENDENT_AMBULATORY_CARE_PROVIDER_SITE_OTHER): Admitting: Family Medicine

## 2016-06-27 ENCOUNTER — Encounter: Payer: Self-pay | Admitting: Family Medicine

## 2016-06-27 DIAGNOSIS — J45909 Unspecified asthma, uncomplicated: Secondary | ICD-10-CM | POA: Insufficient documentation

## 2016-06-27 DIAGNOSIS — J4540 Moderate persistent asthma, uncomplicated: Secondary | ICD-10-CM | POA: Diagnosis not present

## 2016-06-27 MED ORDER — FLUTICASONE FUROATE-VILANTEROL 100-25 MCG/INH IN AEPB: 1.0000 | INHALATION_SPRAY | Freq: Every day | RESPIRATORY_TRACT | 3 refills | Status: DC

## 2016-06-27 MED ORDER — ALBUTEROL SULFATE HFA 108 (90 BASE) MCG/ACT IN AERS: 2.0000 | INHALATION_SPRAY | Freq: Four times a day (QID) | RESPIRATORY_TRACT | 3 refills | Status: DC | PRN

## 2016-06-27 NOTE — Progress Notes (Signed)
   Subjective:    Patient ID: Laura RummageSheila Sleeth, female    DOB: 03/03/1965, 51 y.o.   MRN: [id number]  Patient presents for Illness (x2 weeks- wheezing, SOB upon exertion, seasonal allergies)  Pt here with wheezing, SOB with exertion, especially when walking up stairs. States it is intermittant episodes, she also gets a dry hacky cough at times. She has history of asthma as child, has used albuterl inhaler as needed over the past year but feels her symptoms are worsening. She denies any fever, does not feel "sick". She does cough more with the pollen. Does not take any anti-histamines. She has also gained 10lbs since our last visit in Jan, admits to not eating right and not exercising. Gets some ankle swelling that goes down if she props her feet up, typically occurs after her 16 hour shifts.  No CHest pain , no palpitations      Review Of Systems:  GEN- denies fatigue, fever, weight loss,weakness, recent illness HEENT- denies eye drainage, change in vision, nasal discharge, CVS- denies chest pain, palpitations RESP- + SOB, +cough, +wheeze ABD- denies N/V, change in stools, abd pain GU- denies dysuria, hematuria, dribbling, incontinence MSK- denies joint pain, muscle aches, injury Neuro- denies headache, dizziness, syncope, seizure activity       Objective:    BP 132/70   Pulse 66   Temp 98.6 F (37 C) (Oral)   Resp 16   Ht 5\' 3"  (1.6 m)   Wt 259 lb (117.5 kg)   SpO2 98%   BMI 45.88 kg/m  GEN- NAD, alert and oriented x3 HEENT- PERRL, EOMI, non injected sclera, pink conjunctiva, MMM, oropharynx mild injection, TM clear bilat no effusion,  + maxillary sinus tenderness, inflammed turbinates,  Nasal drainage  Neck- Supple, no LAD CVS- RRR, no murmur RESP-CTAB EXT- No edema Pulses- Radial 2+   Peak Flow-  250/ 300/275       Assessment & Plan:      Problem List Items Addressed This Visit    Morbid obesity (HCC)   Asthma    Concern for worsening asthma, likley mediated  by the pollen. Start Breo daily, use albuterol for rescue only. Discussed her weight and how this plays a role on her breathing and her endurance as well No overt sign of heart failure Advised she can wear compression hose to work for leg swelling, cut out sodium as well F/U 1 month for physical and re-evaluation on new inhaler      Relevant Medications   fluticasone furoate-vilanterol (BREO ELLIPTA) 100-25 MCG/INH AEPB   albuterol (VENTOLIN HFA) 108 (90 Base) MCG/ACT inhaler      Note: This dictation was prepared with Dragon dictation along with smaller phrase technology. Any transcriptional errors that result from this process are unintentional.

## 2016-06-27 NOTE — Patient Instructions (Signed)
F/U 1 Month for Physical/Asthma

## 2016-06-27 NOTE — Assessment & Plan Note (Signed)
Concern for worsening asthma, likley mediated by the pollen. Start Breo daily, use albuterol for rescue only. Discussed her weight and how this plays a role on her breathing and her endurance as well No overt sign of heart failure Advised she can wear compression hose to work for leg swelling, cut out sodium as well F/U 1 month for physical and re-evaluation on new inhaler

## 2016-06-30 ENCOUNTER — Telehealth: Payer: Self-pay | Admitting: *Deleted

## 2016-06-30 MED ORDER — FLUTICASONE FUROATE-VILANTEROL 100-25 MCG/INH IN AEPB: 1.0000 | INHALATION_SPRAY | Freq: Every day | RESPIRATORY_TRACT | 3 refills | Status: DC

## 2016-06-30 NOTE — Telephone Encounter (Signed)
Received PA determination.   PA Case [id number] Approved 05/31/2016- 02/16/2098.

## 2016-06-30 NOTE — Telephone Encounter (Signed)
Received request from pharmacy for PA on   PA submitted.   Dx: J45.40- asthma.

## 2016-07-11 ENCOUNTER — Encounter: Payer: Self-pay | Admitting: Physician Assistant

## 2016-08-01 ENCOUNTER — Ambulatory Visit (INDEPENDENT_AMBULATORY_CARE_PROVIDER_SITE_OTHER): Admitting: Family Medicine

## 2016-08-01 ENCOUNTER — Encounter: Payer: Self-pay | Admitting: Family Medicine

## 2016-08-01 VITALS — BP 130/72 | HR 70 | Temp 98.4°F | Resp 16 | Ht 63.0 in | Wt 261.0 lb

## 2016-08-01 DIAGNOSIS — J4540 Moderate persistent asthma, uncomplicated: Secondary | ICD-10-CM

## 2016-08-01 DIAGNOSIS — Z1231 Encounter for screening mammogram for malignant neoplasm of breast: Secondary | ICD-10-CM

## 2016-08-01 DIAGNOSIS — Z800 Family history of malignant neoplasm of digestive organs: Secondary | ICD-10-CM | POA: Diagnosis not present

## 2016-08-01 DIAGNOSIS — Z1239 Encounter for other screening for malignant neoplasm of breast: Secondary | ICD-10-CM

## 2016-08-01 DIAGNOSIS — Z1211 Encounter for screening for malignant neoplasm of colon: Secondary | ICD-10-CM | POA: Diagnosis not present

## 2016-08-01 MED ORDER — FLUTICASONE FUROATE-VILANTEROL 100-25 MCG/INH IN AEPB: 1.0000 | INHALATION_SPRAY | Freq: Every day | RESPIRATORY_TRACT | 2 refills | Status: DC

## 2016-08-01 NOTE — Patient Instructions (Addendum)
NUtrition referral  Continue Breo  Referral for colonoscopy Schedule your mammogram  F/U as previous for physical

## 2016-08-01 NOTE — Addendum Note (Signed)
Addended by: Milinda AntisURHAM, Trystin Hargrove F on: 08/01/2016 01:47 PM   Modules accepted: Orders

## 2016-08-01 NOTE — Assessment & Plan Note (Signed)
Very good response to the Breo, continue, no SE, no thrush, no use of her rescue inhaler

## 2016-08-01 NOTE — Assessment & Plan Note (Addendum)
Discussed dietary changes, she is actually [employer] history, has gained 100lbs since leaving the [employer] in 2006. She eats out a lot and late  Nutrition referral

## 2016-08-01 NOTE — Progress Notes (Signed)
   Subjective:    Patient ID: Laura RummageSheila Siddiqi, female    DOB: 04/02/1965, 51 y.o.   MRN: [id number]  Patient presents for 1 month F/U (is fasting) Here to follow-up asthma. She was started on BREO at her last visit. Weight is up 2 lbs has physical scheduled  Needs referral for colonoscopy ,has family history of colon cancer, she had early colonoscopy at age 51 Has not used the albuterol , traveled to [place] recently did not have any troubles       Review Of Systems:  GEN- denies fatigue, fever, weight loss,weakness, recent illness HEENT- denies eye drainage, change in vision, nasal discharge, CVS- denies chest pain, palpitations RESP- denies SOB, cough, wheeze ABD- denies N/V, change in stools, abd pain GU- denies dysuria, hematuria, dribbling, incontinence MSK- denies joint pain, muscle aches, injury Neuro- denies headache, dizziness, syncope, seizure activity       Objective:    BP 130/72   Pulse 70   Temp 98.4 F (36.9 C) (Oral)   Resp 16   Ht 5\' 3"  (1.6 m)   Wt 261 lb (118.4 kg)   SpO2 98%   BMI 46.23 kg/m  GEN- NAD, alert and oriented x3 HEENT- PERRL, EOMI, non injected sclera, pink conjunctiva, MMM, oropharynx clear CVS- RRR, no murmur RESP-CTAB EXT- No edema Pulses- Radial  2+  Peak Flow 350/400/400     Assessment & Plan:      Problem List Items Addressed This Visit    Morbid obesity (HCC) - Primary    Discussed dietary changes, she is actually [employer] history, has gained 100lbs since leaving the [employer] in 2006. She eats out a lot and late       Asthma    [place], continue, no SE, no thrush, no use of her rescue inhaler       Relevant Medications   fluticasone furoate-vilanterol (BREO ELLIPTA) 100-25 MCG/INH AEPB    Other Visit Diagnoses    Colon cancer screening       Relevant Orders   Ambulatory referral to Gastroenterology   Family history of colon cancer       Relevant Orders   Ambulatory referral to  Gastroenterology   Breast cancer screening       Relevant Orders   MM DIAG BREAST TOMO BILATERAL      Note: This dictation was prepared with Dragon dictation along with smaller phrase technology. Any transcriptional errors that result from this process are unintentional.

## 2016-08-18 LAB — HM MAMMOGRAPHY

## 2016-08-21 ENCOUNTER — Encounter: Payer: Self-pay | Admitting: Physician Assistant

## 2016-08-29 ENCOUNTER — Encounter: Payer: Self-pay | Admitting: *Deleted

## 2016-09-04 ENCOUNTER — Other Ambulatory Visit: Payer: Self-pay | Admitting: Family Medicine

## 2016-09-04 DIAGNOSIS — Z1239 Encounter for other screening for malignant neoplasm of breast: Secondary | ICD-10-CM

## 2016-09-04 NOTE — Progress Notes (Signed)
Changed Dx to screening

## 2016-09-22 ENCOUNTER — Encounter: Payer: Self-pay | Admitting: Family Medicine

## 2016-09-22 ENCOUNTER — Ambulatory Visit (INDEPENDENT_AMBULATORY_CARE_PROVIDER_SITE_OTHER): Admitting: Family Medicine

## 2016-09-22 VITALS — BP 128/70 | HR 72 | Temp 98.5°F | Resp 14 | Ht 63.0 in | Wt 260.0 lb

## 2016-09-22 DIAGNOSIS — Z0000 Encounter for general adult medical examination without abnormal findings: Secondary | ICD-10-CM | POA: Diagnosis not present

## 2016-09-22 DIAGNOSIS — E559 Vitamin D deficiency, unspecified: Secondary | ICD-10-CM

## 2016-09-22 DIAGNOSIS — Z23 Encounter for immunization: Secondary | ICD-10-CM | POA: Diagnosis not present

## 2016-09-22 DIAGNOSIS — Z124 Encounter for screening for malignant neoplasm of cervix: Secondary | ICD-10-CM | POA: Diagnosis not present

## 2016-09-22 DIAGNOSIS — J4540 Moderate persistent asthma, uncomplicated: Secondary | ICD-10-CM | POA: Diagnosis not present

## 2016-09-22 LAB — CBC WITH DIFFERENTIAL/PLATELET
Basophils Absolute: 0 cells/uL (ref 0–200)
Basophils Relative: 0 %
EOS PCT: 3 %
Eosinophils Absolute: 147 cells/uL (ref 15–500)
HCT: 36.7 % (ref 35.0–45.0)
Hemoglobin: 12 g/dL (ref 12.0–15.0)
LYMPHS ABS: 1421 {cells}/uL (ref 850–3900)
Lymphocytes Relative: 29 %
MCH: 29.3 pg (ref 27.0–33.0)
MCHC: 32.7 g/dL (ref 32.0–36.0)
MCV: 89.5 fL (ref 80.0–100.0)
MONOS PCT: 6 %
MPV: 12 fL (ref 7.5–12.5)
Monocytes Absolute: 294 cells/uL (ref 200–950)
NEUTROS ABS: 3038 {cells}/uL (ref 1500–7800)
Neutrophils Relative %: 62 %
PLATELETS: 189 10*3/uL (ref 140–400)
RBC: 4.1 MIL/uL (ref 3.80–5.10)
RDW: 15.1 % — ABNORMAL HIGH (ref 11.0–15.0)
WBC: 4.9 10*3/uL (ref 3.8–10.8)

## 2016-09-22 LAB — COMPREHENSIVE METABOLIC PANEL
ALBUMIN: 4.2 g/dL (ref 3.6–5.1)
ALT: 19 U/L (ref 6–29)
AST: 17 U/L (ref 10–35)
Alkaline Phosphatase: 69 U/L (ref 33–130)
BILIRUBIN TOTAL: 0.4 mg/dL (ref 0.2–1.2)
BUN: 11 mg/dL (ref 7–25)
CALCIUM: 9.1 mg/dL (ref 8.6–10.4)
CHLORIDE: 105 mmol/L (ref 98–110)
CO2: 27 mmol/L (ref 20–32)
CREATININE: 0.85 mg/dL (ref 0.50–1.05)
Glucose, Bld: 93 mg/dL (ref 70–99)
Potassium: 4.6 mmol/L (ref 3.5–5.3)
SODIUM: 141 mmol/L (ref 135–146)
TOTAL PROTEIN: 7 g/dL (ref 6.1–8.1)

## 2016-09-22 LAB — LIPID PANEL
CHOLESTEROL: 155 mg/dL (ref <200)
HDL: 56 mg/dL (ref >50)
LDL Cholesterol: 91 mg/dL (ref <100)
Total CHOL/HDL Ratio: 2.8 Ratio (ref <5.0)
Triglycerides: 39 mg/dL (ref <150)
VLDL: 8 mg/dL (ref <30)

## 2016-09-22 NOTE — Progress Notes (Signed)
   Subjective:    Patient ID: Laura RummageSheila Ryan, female    DOB: 03/28/1965, 51 y.o.   MRN: [id number]  Patient presents for CPE with PAP (is fasting) Patient here for complete physical exam with Pap smear. Due for colonoscopy Mammogram up-to-date Immunizations-shingles vaccine done/tetanus, she's not had pneumonia vaccine but does have history of asthma  Asthma she is currently on Breo, and albuterol for rescue inhaler   Colonoscopy- scheduled for this month with Dr. Leandro ReasonerMann  Insurance would not cover the nutitionist   Review Of Systems:  GEN- denies fatigue, fever, weight loss,weakness, recent illness HEENT- denies eye drainage, change in vision, nasal discharge, CVS- denies chest pain, palpitations RESP- denies SOB, cough, wheeze ABD- denies N/V, change in stools, abd pain GU- denies dysuria, hematuria, dribbling, incontinence MSK- denies joint pain, muscle aches, injury Neuro- denies headache, dizziness, syncope, seizure activity       Objective:    BP 128/70   Pulse 72   Temp 98.5 F (36.9 C) (Oral)   Resp 14   Ht 5\' 3"  (1.6 m)   Wt 260 lb (117.9 kg)   SpO2 98%   BMI 46.06 kg/m  GEN- NAD, alert and oriented x3 HEENT- PERRL, EOMI, non injected sclera, pink conjunctiva, MMM, oropharynx clear Neck- Supple, no thyromegaly Breast- normal symmetry, no nipple inversion,no nipple drainage, no nodules or lumps felt Nodes- no axillary nodes CVS- RRR, no murmur RESP-CTAB ABD-NABS,soft,NT,ND GU- normal external genitalia, vaginal mucosa pink and moist, cervix visualized no growth, no blood form os, no  discharge, no CMT, no ovarian masses, uterus normal size EXT- No edema Pulses- Radial, DP- 2+        Assessment & Plan:      Problem List Items Addressed This Visit    Vitamin D deficiency   Relevant Orders   Vitamin D, 25-hydroxy   Morbid obesity (HCC)   Asthma    Controlled with Breo, has not used rescue inhaler Pneumonia vaccine given      Relevant Orders   Pneumococcal polysaccharide vaccine 23-valent greater than or equal to 2yo subcutaneous/IM (Completed)    Other Visit Diagnoses    Routine general medical examination at a health care facility    -  Primary   CPE done, fasting labs today, colonoscopy scheduled. Discussed eating habits, cut out proceed food, fast food, avoid soda. [facility]   Relevant Orders   CBC with Differential/Platelet   Comprehensive metabolic panel   Lipid panel   Pneumococcal polysaccharide vaccine 23-valent greater than or equal to 2yo subcutaneous/IM (Completed)   Cervical cancer screening       Relevant Orders   PAP, ThinPrep ASCUS Rflx HPV Rflx Type   Need for vaccination against Streptococcus pneumoniae       Relevant Orders   Pneumococcal polysaccharide vaccine 23-valent greater than or equal to 2yo subcutaneous/IM (Completed)      Note: This dictation was prepared with Dragon dictation along with smaller phrase technology. Any transcriptional errors that result from this process are unintentional.

## 2016-09-22 NOTE — Patient Instructions (Signed)
F/U 6 months  I recommend eye visit once a year I recommend dental visit every 6 months Goal is to  Exercise 30 minutes 5 days a week We will send a letter with lab results  Pneumonia vaccine given

## 2016-09-22 NOTE — Assessment & Plan Note (Signed)
Controlled with Breo, has not used rescue inhaler Pneumonia vaccine given

## 2016-09-23 LAB — VITAMIN D 25 HYDROXY (VIT D DEFICIENCY, FRACTURES): Vit D, 25-Hydroxy: 23 ng/mL — ABNORMAL LOW (ref 30–100)

## 2016-09-24 LAB — PAP THINPREP ASCUS RFLX HPV RFLX TYPE

## 2016-09-25 ENCOUNTER — Encounter: Payer: Self-pay | Admitting: *Deleted

## 2016-12-22 ENCOUNTER — Encounter: Payer: Self-pay | Admitting: Family Medicine

## 2017-02-28 ENCOUNTER — Other Ambulatory Visit: Payer: Self-pay

## 2017-02-28 ENCOUNTER — Emergency Department (HOSPITAL_COMMUNITY)
Admission: EM | Admit: 2017-02-28 | Discharge: 2017-02-28 | Disposition: A | Discharge status: Home / Self Care | Attending: Emergency Medicine | Admitting: Emergency Medicine

## 2017-02-28 ENCOUNTER — Emergency Department (HOSPITAL_COMMUNITY)

## 2017-02-28 ENCOUNTER — Encounter (HOSPITAL_COMMUNITY): Payer: Self-pay

## 2017-02-28 DIAGNOSIS — J069 Acute upper respiratory infection, unspecified: Secondary | ICD-10-CM | POA: Insufficient documentation

## 2017-02-28 DIAGNOSIS — B9789 Other viral agents as the cause of diseases classified elsewhere: Secondary | ICD-10-CM | POA: Diagnosis not present

## 2017-02-28 DIAGNOSIS — J45909 Unspecified asthma, uncomplicated: Secondary | ICD-10-CM | POA: Insufficient documentation

## 2017-02-28 DIAGNOSIS — R05 Cough: Secondary | ICD-10-CM | POA: Diagnosis present

## 2017-02-28 MED ORDER — FLUTICASONE PROPIONATE 50 MCG/ACT NA SUSP: 1.0000 | Freq: Every day | NASAL | 2 refills | Status: DC

## 2017-02-28 MED ORDER — IBUPROFEN 800 MG PO TABS: 800.0000 mg | ORAL_TABLET | Freq: Three times a day (TID) | ORAL | 0 refills | Status: DC

## 2017-02-28 MED ORDER — ALBUTEROL SULFATE HFA 108 (90 BASE) MCG/ACT IN AERS: 2.0000 | INHALATION_SPRAY | Freq: Four times a day (QID) | RESPIRATORY_TRACT | 3 refills | Status: DC | PRN

## 2017-02-28 MED ORDER — BENZONATATE 100 MG PO CAPS: 100.0000 mg | ORAL_CAPSULE | Freq: Three times a day (TID) | ORAL | 0 refills | Status: DC | PRN

## 2017-02-28 NOTE — ED Triage Notes (Signed)
Pt unable to find Albuterol inhaler so has not been using but has been using Breo ellipta every day.

## 2017-02-28 NOTE — ED Provider Notes (Signed)
[facility]   CSN: [id number] Arrival date & time: 02/28/17  1647     History   Chief Complaint Chief Complaint  Patient presents with  . Cough    HPI Laura Ryan is a 52 y.o. female with a hx of asthma presents to the ED with complaint of cough x 5 days. States sxs started with congestion, rhinorrhea, and dry cough. Cough has worsened and is now productive with yellow mucous/phlegm. States she has noted some chills and fatigue, no fevers. Husband has told her she sounds as if she is wheezing at times. States having chest discomfort and dyspnea only with coughing, otherwise none.Has tried mucinex and OTC cough syrup and has been using her prescribed Breo inhaler daily without significant relief, currently unable to find her Albuterol inhaler.  Denies palpitations, abdominal pain, vomiting, diarrhea, blood in stool, or dysuria.   Has tried mucinex  HPI  Past Medical History:  Diagnosis Date  . Asthma   . Obese     Patient Active Problem List   Diagnosis Date Noted  . Asthma 06/27/2016  . Morbid obesity (HCC) 06/27/2016  . Vitamin D deficiency 03/07/2013    History reviewed. No pertinent surgical history.  OB History    No data available       Home Medications    Prior to Admission medications   Medication Sig Start Date End Date Taking? Authorizing Provider  albuterol (VENTOLIN HFA) 108 (90 Base) MCG/ACT inhaler Inhale 2 puffs into the lungs every 6 (six) hours as needed for wheezing or shortness of breath. 06/27/16  Yes [place], Velna HatchetKawanta F, MD  fluticasone furoate-vilanterol (BREO ELLIPTA) 100-25 MCG/INH AEPB Inhale 1 puff into the lungs daily. 08/01/16  Yes [place], Velna HatchetKawanta F, MD    Family History Family History  Problem Relation Age of Onset  . Cancer Mother [id number]       Colon Cancer  . Alcohol abuse Father     Social History Social History   Tobacco Use  . Smoking status: Never Smoker  . Smokeless  tobacco: Never Used  Substance Use Topics  . Alcohol use: Yes    Comment: socially  . Drug use: No     Allergies   Patient has no known allergies.   Review of Systems Review of Systems  Constitutional: Positive for appetite change (decreased), chills and fatigue. Negative for fever.  HENT: Positive for congestion and rhinorrhea. Negative for ear pain and sore throat.   Respiratory: Positive for cough, shortness of breath (with coughing) and wheezing.   Cardiovascular: Positive for chest pain (only with coughing). Negative for palpitations.  Gastrointestinal: Negative for abdominal pain, blood in stool, diarrhea and vomiting.  Genitourinary: Negative for dysuria.  All other systems reviewed and are negative.    Physical Exam Updated Vital Signs BP 129/69   Pulse 82   Temp 99.1 F (37.3 C) (Oral)   Resp 18   Ht 5\' 3"  (1.6 m)   Wt 120.2 kg (265 lb)   LMP  (LMP Unknown) Comment: Hasn't had period in 6-8 months  SpO2 97%   BMI 46.94 kg/m   Physical Exam  Constitutional: She appears well-developed and well-nourished.  Non-toxic appearance. No distress.  HENT:  Head: Normocephalic and atraumatic.  Right Ear: Tympanic membrane is not perforated, not erythematous, not retracted and not bulging.  Left Ear: Tympanic membrane is not perforated, not erythematous, not retracted and not bulging.  Nose: Mucosal edema present. Right sinus exhibits no  maxillary sinus tenderness and no frontal sinus tenderness. Left sinus exhibits no maxillary sinus tenderness and no frontal sinus tenderness.  Mouth/Throat: Uvula is midline and oropharynx is clear and moist. No oropharyngeal exudate or posterior oropharyngeal erythema.  Eyes: Conjunctivae are normal. Pupils are equal, round, and reactive to light. Right eye exhibits no discharge. Left eye exhibits no discharge.  Neck: Normal range of motion. Neck supple.  Cardiovascular: Normal rate and regular rhythm.  No murmur  heard. Pulmonary/Chest: Breath sounds normal. No respiratory distress. She has no wheezes. She has no rales.  Abdominal: Soft. She exhibits no distension. There is no tenderness.  Lymphadenopathy:    She has no cervical adenopathy.  Neurological: She is alert.  Skin: Skin is warm and dry. No rash noted.  Psychiatric: She has a normal mood and affect. Her behavior is normal.  Nursing note and vitals reviewed.   ED Treatments / Results  Labs (all labs ordered are listed, but only abnormal results are displayed) Labs Reviewed - No data to display  EKG  EKG Interpretation None       Radiology Dg Chest 2 View  Result Date: 02/28/2017 CLINICAL DATA:  Cough and chest pain for several days. EXAM: CHEST  2 VIEW COMPARISON:  May 15, 2015 FINDINGS: The heart size and mediastinal contours are within normal limits. Both lungs are clear. The visualized skeletal structures are unremarkable. IMPRESSION: No active cardiopulmonary disease. Electronically Signed   By: Sherian Rein M.D.   On: 02/28/2017 18:25    Procedures Procedures (including critical care time)  Medications Ordered in ED Medications - No data to display   Initial Impression / Assessment and Plan / ED Course  I have reviewed the triage vital signs and the nursing notes.  Pertinent labs & imaging results that were available during my care of the patient were reviewed by me and considered in my medical decision making (see chart for details).    Patient presents with symptoms consistent with viral illness. She is nontoxic appearing, vitals WNL. Patient is afebrile and without adventitious sounds on lung exam, CXR negative for infiltrate, doubt PNA.  Afebrile, no sinus tenderness, doubt sinusitis. Centor score 0, doubt strep pharyngitis. No wheezing on exam, however patient with hx of asthma, unable to locate albuterol inhaler therefore will refill this. Suspect viral etiology, will treat supportively with Ibuprofen, Flonase,  and Tessalon in addition to inhaler. I discussed results, treatment plan, need for PCP follow-up, and return precautions with the patient. Provided opportunity for questions, patient confirmed understanding and is in agreement with plan.    Final Clinical Impressions(s) / ED Diagnoses   Final diagnoses:  Viral URI with cough    ED Discharge Orders        Ordered    albuterol (VENTOLIN HFA) 108 (90 Base) MCG/ACT inhaler  Every 6 hours PRN    Comments:  Pt previously had Proventil please prescribed asked   02/28/17 1847    fluticasone (FLONASE) 50 MCG/ACT nasal spray  Daily     02/28/17 1847    benzonatate (TESSALON) 100 MG capsule  3 times daily PRN     02/28/17 1847    ibuprofen (ADVIL,MOTRIN) 800 MG tablet  3 times daily     02/28/17 1847       Marielle Mantione, Pleas Koch, PA-C 02/28/17 1847    Alvira Monday, MD 03/01/17 1402

## 2017-02-28 NOTE — ED Notes (Signed)
Declined W/C at D/C and was escorted to lobby by RN. 

## 2017-02-28 NOTE — ED Triage Notes (Signed)
Onset 5 days productive cough- yellow/brown phlegm, runny nose, nasal congestion.  No respiratory or swallowing difficulties.  No one in family with same symptoms. Has tried fluids, mucinex, OJ with some relief.  Cough has worsened.  Interfering with work and sleep.

## 2017-02-28 NOTE — Discharge Instructions (Signed)
You were seen in the emergency today and diagnosed with a viral illness.  I have prescribed you multiple medications to treat your symptoms.   - Ventolin- this is your prescribed inhaler that you are unable to locate- use this as needed 2 puffs every 6 hours.   -Flonase to be used 1 spray in each nostril daily.  This medication is used to treat your congestion.  -Tessalon can be taken once every 8 hours as needed.  This medication is used to treat your cough.  -Ibuprofen to be taken once every 8 hours as needed for pain.  You will need to follow-up with your primary care provider in 1 week if your symptoms have not improved.  If you do not have a primary care provider one is provided in your discharge instructions.  Return to the emergency department for any new or worsening symptoms including but not limited to fever, difficulty breathing, chest pain, or passing out.

## 2017-03-27 ENCOUNTER — Ambulatory Visit (INDEPENDENT_AMBULATORY_CARE_PROVIDER_SITE_OTHER): Admitting: Family Medicine

## 2017-03-27 ENCOUNTER — Other Ambulatory Visit: Payer: Self-pay

## 2017-03-27 ENCOUNTER — Encounter: Payer: Self-pay | Admitting: Family Medicine

## 2017-03-27 VITALS — BP 112/68 | HR 70 | Temp 97.9°F | Resp 16 | Ht 63.0 in | Wt 264.0 lb

## 2017-03-27 DIAGNOSIS — J4540 Moderate persistent asthma, uncomplicated: Secondary | ICD-10-CM | POA: Diagnosis not present

## 2017-03-27 MED ORDER — PHENTERMINE HCL 37.5 MG PO TABS: 37.5000 mg | ORAL_TABLET | Freq: Every day | ORAL | 1 refills | Status: DC

## 2017-03-27 NOTE — Progress Notes (Signed)
   Subjective:    Patient ID: Laura RummageSheila Ryan, female    DOB: 07/11/1965, 52 y.o.   MRN: [id number]  Patient presents for Follow-up (is not fasting)  Pt here to f/u chronic medical problems  Asthma- currently on Breo ,seen in ED about 3 weeks ago, with cough. CXR was negative. She was given flonaseMotrin,, tessalon for viral URI. 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   24 hour recall- Tropical smoothe with protein/supplements, Lunch- Fried Chicken, Roll , Dinner- [place] legs- baked, salad, had cookies , Chips , at work had 100 calorie popcorn, and lean cuisine healthy choice, tangerine, drinking water flavored  Works 3rd shift  Was on phentermine about 5-6 years ago, lost about 25lbs, was on for a few months  Had weight problems all the way back to [employer] was on appetite suppressant          Review Of Systems:  GEN- denies fatigue, fever, weight loss,weakness, recent illness HEENT- denies eye drainage, change in vision, nasal discharge, CVS- denies chest pain, palpitations RESP- denies SOB, cough, wheeze ABD- denies N/V, change in stools, abd pain GU- denies dysuria, hematuria, dribbling, incontinence MSK- denies joint pain, muscle aches, injury Neuro- denies headache, dizziness, syncope, seizure activity       Objective:    BP 112/68   Pulse 70   Temp 97.9 F (36.6 C) (Oral)   Resp 16   Ht 5\' 3"  (1.6 m)   Wt 264 lb (119.7 kg)   LMP  (LMP Unknown) Comment: Hasn't had period in 6-8 months  SpO2 99%   BMI 46.77 kg/m  GEN- NAD, alert and oriented x3  Repeat BP 118/76 HEENT- PERRL, EOMI, non injected sclera, pink conjunctiva, MMM, oropharynx clear Neck- Supple, no thyromegaly CVS- RRR, no murmur RESP-CTAB EXT- No edema Pulses- Radial, DP- 2+        Assessment & Plan:      Problem List Items Addressed This Visit      Unprioritized   Morbid obesity (HCC)    Discussed lower  carbs, fried foods, no soda Discussed phentermine and side effects She has used in past and had good results Start with 1/2 tablet for 2 weeks then proceed to 1 full tablet F/U in 1 month, goal is weight loss of 5lbs by that visit       Relevant Medications   phentermine (ADIPEX-P) 37.5 MG tablet   Asthma - Primary    Overall at baseline continue Breo         Note: This dictation was prepared with Dragon dictation along with smaller phrase technology. Any transcriptional errors that result from this process are unintentional.

## 2017-03-27 NOTE — Patient Instructions (Addendum)
1/2 tablet of phentermine for 2 weeks then go to full dose  F/U 1 month

## 2017-03-27 NOTE — Assessment & Plan Note (Signed)
Discussed lower carbs, fried foods, no soda Discussed phentermine and side effects She has used in past and had good results Start with 1/2 tablet for 2 weeks then proceed to 1 full tablet F/U in 1 month, goal is weight loss of 5lbs by that visit

## 2017-03-27 NOTE — Assessment & Plan Note (Signed)
[facility]

## 2017-04-27 ENCOUNTER — Ambulatory Visit (INDEPENDENT_AMBULATORY_CARE_PROVIDER_SITE_OTHER): Admitting: Family Medicine

## 2017-04-27 ENCOUNTER — Encounter: Payer: Self-pay | Admitting: Family Medicine

## 2017-04-27 ENCOUNTER — Other Ambulatory Visit: Payer: Self-pay

## 2017-04-27 NOTE — Progress Notes (Signed)
   Subjective:    Patient ID: Laura RummageSheila Ryan, female    DOB: 02/26/1965, 52 y.o.   MRN: [id number]  Patient presents for Follow-up (weight loss)   Pt here to f/u obesity. Started on phentermine in Feb , weight 264lbs prior to starting. She has been doing 4 days of cardio, 2 days of weight training with her trainer.  Cut out sugar and carbs   Getting fruits for natural sugar  Taking 1/2 tablet most days, does skip some days taking the medication. Also tracks on Lose it APP- tracking her food   Does some intermittant fasting 16:8 Working on more water intake   Feels good.  No concerns today.  Also noted that she has not had any swelling in her ankles and she has been losing weight    Review Of Systems:  GEN- denies fatigue, fever, weight loss,weakness, recent illness HEENT- denies eye drainage, change in vision, nasal discharge, CVS- denies chest pain, palpitations RESP- denies SOB, cough, wheeze ABD- denies N/V, change in stools, abd pain GU- denies dysuria, hematuria, dribbling, incontinence MSK- denies joint pain, muscle aches, injury Neuro- denies headache, dizziness, syncope, seizure activity       Objective:    BP 116/72   Pulse 62   Temp 98.4 F (36.9 C) (Oral)   Resp 14   Ht 5\' 3"  (1.6 m)   Wt 253 lb (114.8 kg)   SpO2 97%   BMI 44.82 kg/m  GEN- NAD, alert and oriented x3 HEENT- PERRL, EOMI, non injected sclera, pink conjunctiva, MMM, oropharynx clear CVS- RRR, no murmur RESP-CTAB EXT- No edema Pulses- Radial 2+        Assessment & Plan:      Problem List Items Addressed This Visit      Unprioritized   Morbid obesity (HCC) - Primary    Overall she is doing quite well with her change in lifestyle.  She can continue with her trainer as well as tracking her food.  Work on increasing her water intake.  I think that she can stay a half a tablet of phentermine as she has been more consistent with this.  Will also be easier for her to stop in a couple of  months. Her goal is another 10 pounds of weight loss by her next visit in a few months         Note: This dictation was prepared with Dragon dictation along with smaller phrase technology. Any transcriptional errors that result from this process are unintentional.

## 2017-04-27 NOTE — Patient Instructions (Addendum)
Take 1/2 of the phentermine  F/U 3 months

## 2017-04-27 NOTE — Assessment & Plan Note (Signed)
Overall she is doing quite well with her change in lifestyle.  She can continue with her trainer as well as tracking her food.  Work on increasing her water intake.  I think that she can stay a half a tablet of phentermine as she has been more consistent with this.  Will also be easier for her to stop in a couple of months. Her goal is another 10 pounds of weight loss by her next visit in a few months

## 2017-07-14 ENCOUNTER — Telehealth: Payer: Self-pay | Admitting: *Deleted

## 2017-07-14 NOTE — Telephone Encounter (Signed)
Received request from pharmacy for PA on Phentermine.   PA submitted.   Dx: E66.9- obesity.   Received immediate determination.   Case ID [id number] approved 06/14/2017- 07/14/2018.  Pharmacy made aware.

## 2017-07-28 ENCOUNTER — Ambulatory Visit (INDEPENDENT_AMBULATORY_CARE_PROVIDER_SITE_OTHER): Admitting: Family Medicine

## 2017-07-28 ENCOUNTER — Other Ambulatory Visit: Payer: Self-pay

## 2017-07-28 ENCOUNTER — Encounter: Payer: Self-pay | Admitting: Family Medicine

## 2017-07-28 MED ORDER — PHENTERMINE HCL 37.5 MG PO TABS: 37.5000 mg | ORAL_TABLET | Freq: Every day | ORAL | 1 refills | Status: DC

## 2017-07-28 NOTE — Progress Notes (Signed)
   Subjective:    Patient ID: Laura RummageSheila Ryan, female    DOB: 03/16/1965, 52 y.o.   MRN: [id number]  Patient presents for Follow-up (is not fasting)  Pt here to f/u chronic medical problems   Medications reviewed  No concerns today  She has lost 23lbs since Feb, taking phentermine 1 tablet a few days a week, sometimes forgets  she is exercising 4 days a week she is no longer doing intermittent fasting states that she got off on this.  But she is watching her carbs and sugars she is avoiding soda.  Increase in her water intake.  Since her visit in March she has lost 12 pounds her goal is to lose 10 pounds by this visit.  Side effects of the medication. Review Of Systems:  GEN- denies fatigue, fever, weight loss,weakness, recent illness HEENT- denies eye drainage, change in vision, nasal discharge, CVS- denies chest pain, palpitations RESP- denies SOB, cough, wheeze ABD- denies N/V, change in stools, abd pain GU- denies dysuria, hematuria, dribbling, incontinence MSK- denies joint pain, muscle aches, injury Neuro- denies headache, dizziness, syncope, seizure activity       Objective:    BP 132/80   Pulse 66   Temp 98.7 F (37.1 C) (Oral)   Resp 14   Ht 5\' 3"  (1.6 m)   Wt 241 lb (109.3 kg)   SpO2 100%   BMI 42.69 kg/m  GEN- NAD, alert and oriented x3 HEENT- PERRL, EOMI, non injected sclera, pink conjunctiva, MMM, oropharynx clear CVS- RRR, no murmur RESP-CTAB EXT- No edema Pulses- Radial  2+        Assessment & Plan:      Problem List Items Addressed This Visit      Unprioritized   Morbid obesity (HCC) - Primary    He is progressing well with her weight loss.  Her weight loss has been steady.  She will continue with her exercise and dietary changes.  Continue with phentermine she has not tried to use daily.  We will plan for her physical exam and August where she will have repeat fasting labs.      Relevant Medications   phentermine (ADIPEX-P) 37.5 MG tablet       Note: This dictation was prepared with Dragon dictation along with smaller phrase technology. Any transcriptional errors that result from this process are unintentional.

## 2017-07-28 NOTE — Patient Instructions (Addendum)
F/U Schedule physical for August

## 2017-07-28 NOTE — Assessment & Plan Note (Signed)
He is progressing well with her weight loss.  Her weight loss has been steady.  She will continue with her exercise and dietary changes.  Continue with phentermine she has not tried to use daily.  We will plan for her physical exam and August where she will have repeat fasting labs.

## 2017-08-27 ENCOUNTER — Other Ambulatory Visit: Payer: Self-pay | Admitting: *Deleted

## 2017-08-27 NOTE — Telephone Encounter (Signed)
Received call from patient.   Requested refill on Phentermine.   Ok to refill??  Last office visit/ refill 07/28/2017.

## 2017-08-28 MED ORDER — PHENTERMINE HCL 37.5 MG PO TABS: 37.5000 mg | ORAL_TABLET | Freq: Every day | ORAL | 1 refills | Status: DC

## 2017-10-13 ENCOUNTER — Encounter: Admitting: Family Medicine

## 2018-01-25 ENCOUNTER — Other Ambulatory Visit: Payer: Self-pay | Admitting: *Deleted

## 2018-01-25 MED ORDER — PHENTERMINE HCL 37.5 MG PO TABS: 37.5000 mg | ORAL_TABLET | Freq: Every day | ORAL | 1 refills | Status: DC

## 2018-01-25 NOTE — Telephone Encounter (Signed)
Ok to refill??  Last office visit 07/28/2017.  Last refill 08/28/2017, #1 refill.

## 2018-02-03 ENCOUNTER — Ambulatory Visit: Admitting: Family Medicine

## 2018-02-16 ENCOUNTER — Encounter: Payer: Self-pay | Admitting: Family Medicine

## 2018-03-01 ENCOUNTER — Encounter: Admitting: Family Medicine

## 2018-05-19 ENCOUNTER — Encounter: Admitting: Family Medicine

## 2018-06-14 ENCOUNTER — Other Ambulatory Visit: Payer: Self-pay | Admitting: *Deleted

## 2018-06-14 MED ORDER — ALBUTEROL SULFATE HFA 108 (90 BASE) MCG/ACT IN AERS: 2.0000 | INHALATION_SPRAY | Freq: Four times a day (QID) | RESPIRATORY_TRACT | 3 refills | Status: DC | PRN

## 2018-06-14 MED ORDER — FLUTICASONE FUROATE-VILANTEROL 100-25 MCG/INH IN AEPB: 1.0000 | INHALATION_SPRAY | Freq: Every day | RESPIRATORY_TRACT | 2 refills | Status: DC

## 2018-06-15 ENCOUNTER — Other Ambulatory Visit: Payer: Self-pay | Admitting: *Deleted

## 2018-06-15 MED ORDER — ALBUTEROL SULFATE HFA 108 (90 BASE) MCG/ACT IN AERS: 2.0000 | INHALATION_SPRAY | Freq: Four times a day (QID) | RESPIRATORY_TRACT | 3 refills | Status: DC | PRN

## 2018-06-15 MED ORDER — FLUTICASONE FUROATE-VILANTEROL 100-25 MCG/INH IN AEPB: 1.0000 | INHALATION_SPRAY | Freq: Every day | RESPIRATORY_TRACT | 2 refills | Status: DC

## 2018-07-01 ENCOUNTER — Telehealth: Payer: Self-pay

## 2018-07-01 NOTE — Telephone Encounter (Signed)
PA submitted via CMM for Phentermine HCl 37.5MG  tablets. Clinical override not needed.

## 2018-08-15 IMAGING — CR DG CHEST 2V
2 series · 2 of 2 positions shown · non-contrast
Comparison: May 15, 2015

CLINICAL DATA: Cough and chest pain for several days.

EXAM:
CHEST  2 VIEW

[chest pa]
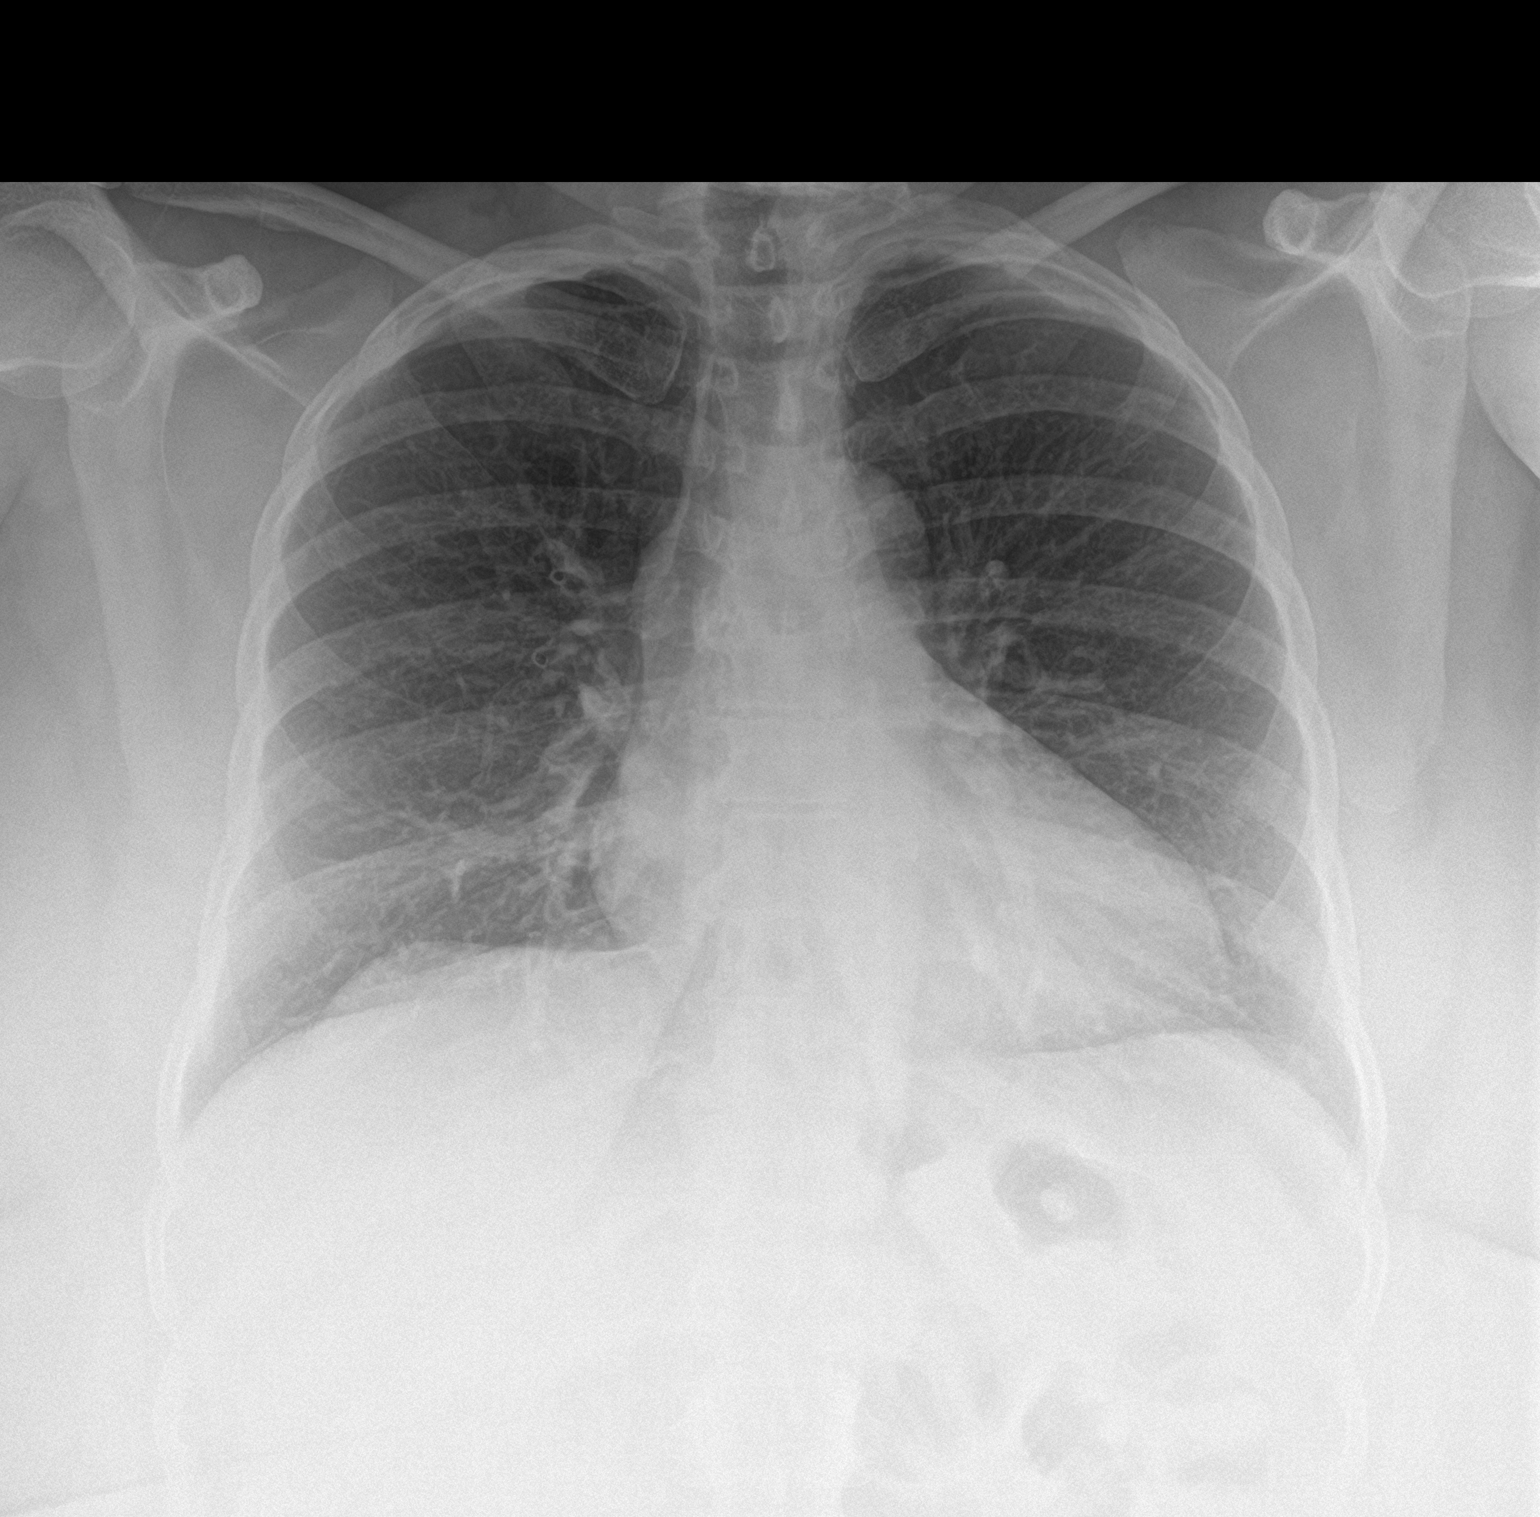

[chest lat]
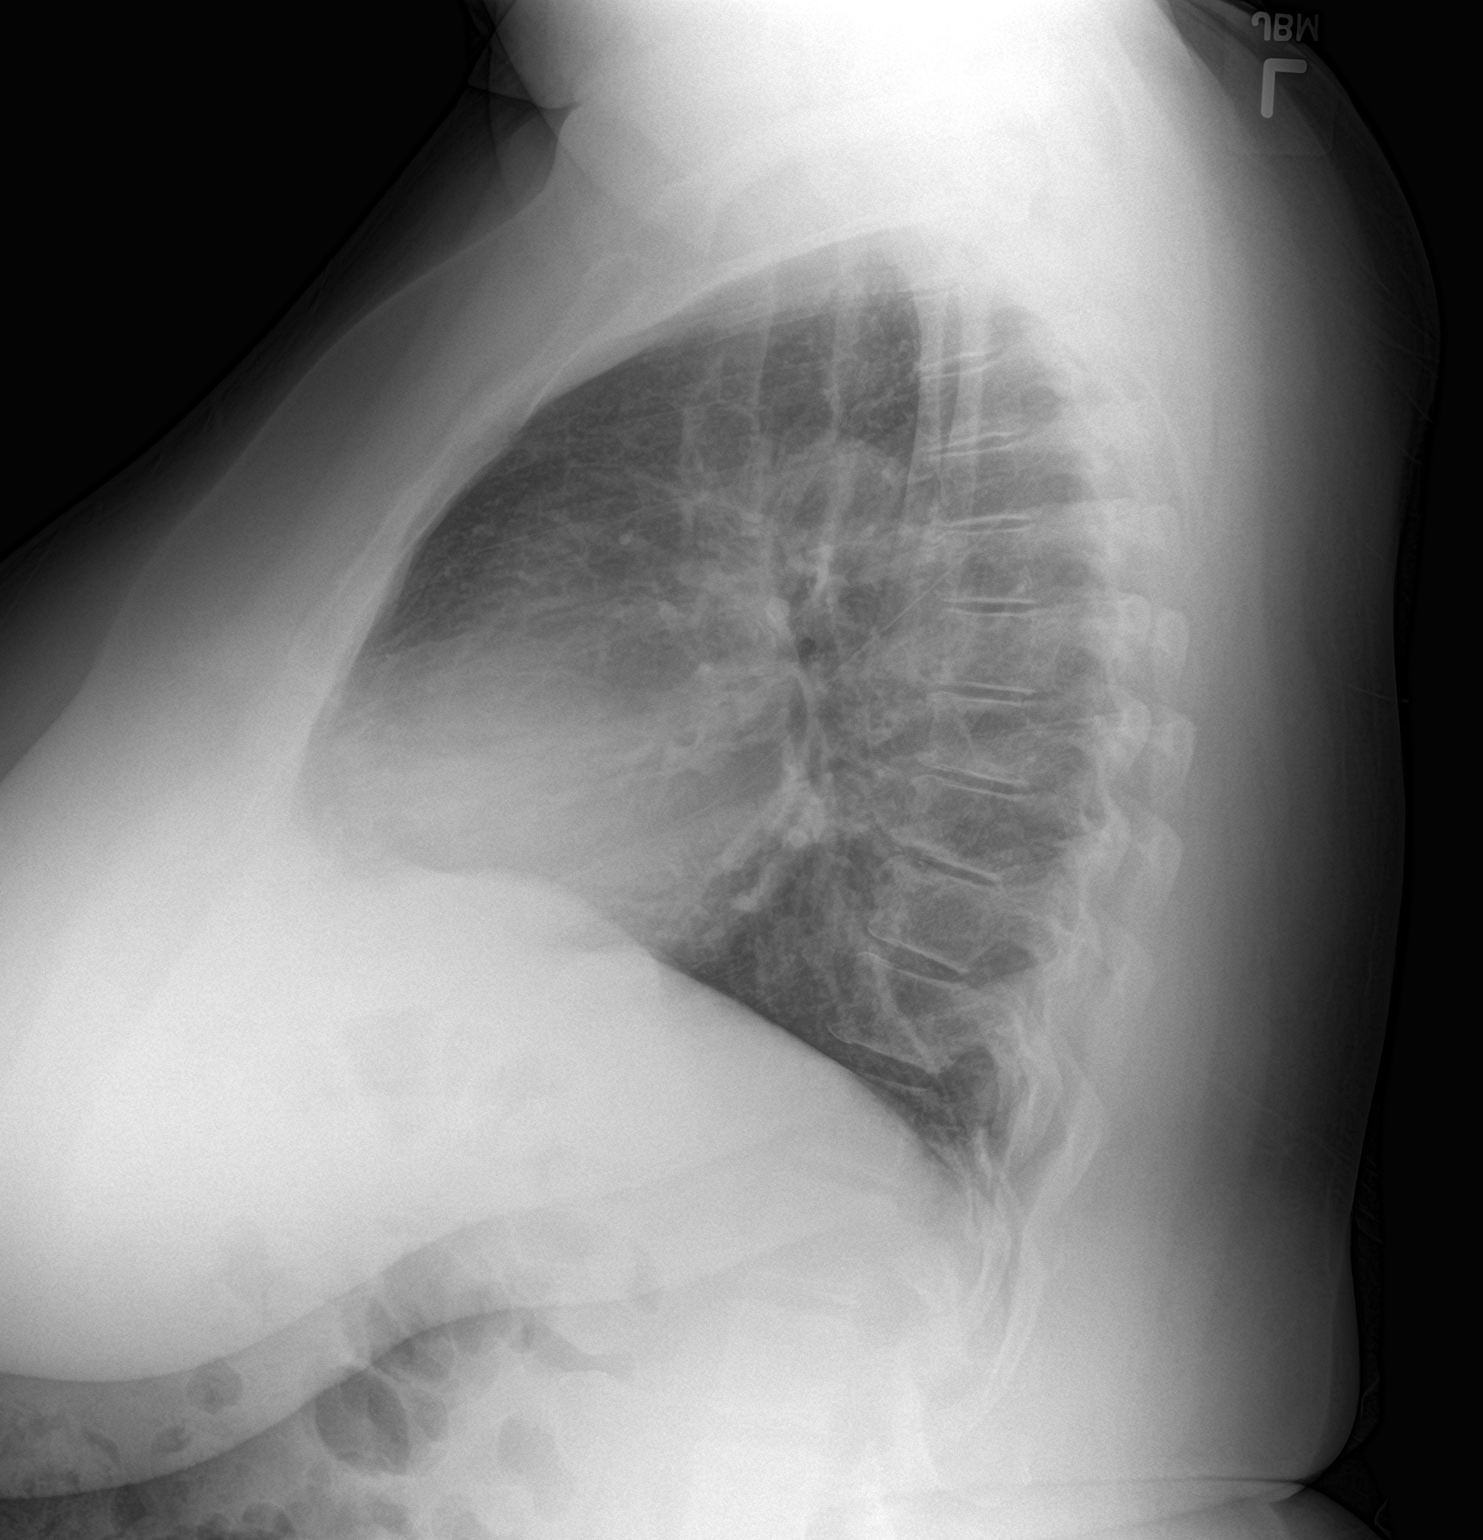

[2 of 2 positions shown; findings below may reference images not displayed]

FINDINGS: The heart size and mediastinal contours are within normal limits.
Both lungs are clear. The visualized skeletal structures are
unremarkable.
IMPRESSION: No active cardiopulmonary disease.

## 2019-06-07 ENCOUNTER — Other Ambulatory Visit: Payer: Self-pay

## 2019-06-07 ENCOUNTER — Telehealth (INDEPENDENT_AMBULATORY_CARE_PROVIDER_SITE_OTHER): Admitting: Nurse Practitioner

## 2019-06-07 DIAGNOSIS — J45901 Unspecified asthma with (acute) exacerbation: Secondary | ICD-10-CM | POA: Diagnosis not present

## 2019-06-07 DIAGNOSIS — J069 Acute upper respiratory infection, unspecified: Secondary | ICD-10-CM

## 2019-06-07 MED ORDER — PREDNISONE 10 MG PO TABS: ORAL_TABLET | ORAL | 0 refills | Status: DC

## 2019-06-07 MED ORDER — CETIRIZINE HCL 10 MG PO TABS: 10.0000 mg | ORAL_TABLET | Freq: Every day | ORAL | 11 refills | Status: DC

## 2019-06-07 MED ORDER — AMOXICILLIN 875 MG PO TABS: 875.0000 mg | ORAL_TABLET | Freq: Two times a day (BID) | ORAL | 0 refills | Status: DC

## 2019-06-07 MED ORDER — FLUTICASONE PROPIONATE 50 MCG/ACT NA SUSP: 1.0000 | Freq: Every day | NASAL | 3 refills | Status: DC

## 2019-06-07 MED ORDER — BENZONATATE 100 MG PO CAPS: 100.0000 mg | ORAL_CAPSULE | Freq: Two times a day (BID) | ORAL | 0 refills | Status: DC | PRN

## 2019-06-07 NOTE — Progress Notes (Signed)
Virtual Visit via Telephone Note  I connected with Laura Ryan on 06/07/19 at  4:00 PM EDT by telephone and verified that I am speaking with the correct person using two identifiers.   I discussed the limitations, risks, security and privacy concerns of performing an evaluation and management service by telephone and the availability of in person appointments. I also discussed with the patient that there may be a patient responsible charge related to this service. The patient expressed understanding and agreed to proceed.   History of Present Illness: PT is a 54 year old female presenting for sxs of brown phlegm production cough, nasal congestion, nasal drainage mild. No fever, chills, loss of taste or smell. Sxs started Monday morning. She has used Mucinex DM started today, increased fluids, resting with minimal relief. She does have asthma. Proair used once this AM, admits she is not using as needed using Breo. She denied using Flonase or antihistamine. 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    No others at home with similar sxs. Past Medical History:  Diagnosis Date  . Asthma   . Obese    No past surgical history on file. No Known Allergies  Observations/Objective: Coughing. No wheezing heard. Able to speak full sentences.   Upper respiratory tract infection, unspecified type - Plan: fluticasone (FLONASE) 50 MCG/ACT nasal spray, cetirizine (ZYRTEC) 10 MG tablet, predniSONE (DELTASONE) 10 MG tablet, amoxicillin (AMOXIL) 875 MG tablet, benzonatate (TESSALON) 100 MG capsule  Exacerbation of asthma, unspecified asthma severity, unspecified whether persistent - Plan: fluticasone (FLONASE) 50 MCG/ACT nasal spray, cetirizine (ZYRTEC) 10 MG tablet, predniSONE (DELTASONE) 10 MG tablet, amoxicillin (AMOXIL) 875 MG tablet, benzonatate (TESSALON) 100 MG capsule   Assessment and Plan: Continue Mucinex as directed RX sent Prednisone taper pack,  Amoxicillin, Tessalon pearls, Flonase, Zyrtec.  Follow up for non resolving or worsening symptoms Get plenty of rest, drink plenty of water, use inhalers as directed, avoid allergan's.  Follow Up Instructions:    I discussed the assessment and treatment plan with the patient. The patient was provided an opportunity to ask questions and all were answered. The patient agreed with the plan and demonstrated an understanding of the instructions.   The patient was advised to call back or seek an in-person evaluation if the symptoms worsen or if the condition fails to improve as anticipated.  I provided 15 minutes of non-face-to-face time during this encounter. End Time 4:15PM  Elmore Guise, FNP

## 2020-07-05 ENCOUNTER — Encounter: Payer: Self-pay | Admitting: Nurse Practitioner

## 2020-07-05 ENCOUNTER — Other Ambulatory Visit: Payer: Self-pay

## 2020-07-05 ENCOUNTER — Telehealth: Admitting: Nurse Practitioner

## 2020-07-05 DIAGNOSIS — Z1211 Encounter for screening for malignant neoplasm of colon: Secondary | ICD-10-CM

## 2020-07-05 DIAGNOSIS — J45901 Unspecified asthma with (acute) exacerbation: Secondary | ICD-10-CM

## 2020-07-05 DIAGNOSIS — Z1231 Encounter for screening mammogram for malignant neoplasm of breast: Secondary | ICD-10-CM

## 2020-07-05 DIAGNOSIS — J069 Acute upper respiratory infection, unspecified: Secondary | ICD-10-CM

## 2020-07-05 MED ORDER — FLUTICASONE PROPIONATE 50 MCG/ACT NA SUSP: 1.0000 | Freq: Every day | NASAL | 3 refills | Status: AC

## 2020-07-05 MED ORDER — ALBUTEROL SULFATE HFA 108 (90 BASE) MCG/ACT IN AERS: 2.0000 | INHALATION_SPRAY | Freq: Four times a day (QID) | RESPIRATORY_TRACT | 3 refills | Status: DC | PRN

## 2020-07-05 MED ORDER — PREDNISONE 20 MG PO TABS: 40.0000 mg | ORAL_TABLET | Freq: Every day | ORAL | 0 refills | Status: AC

## 2020-07-05 MED ORDER — CETIRIZINE HCL 10 MG PO TABS: 10.0000 mg | ORAL_TABLET | Freq: Every day | ORAL | 11 refills | Status: DC
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
## 2020-07-11 ENCOUNTER — Telehealth: Payer: Self-pay | Admitting: Nurse Practitioner

## 2020-07-11 NOTE — Telephone Encounter (Signed)
Left message on patient's voicemail to request call back; need to schedule cpe with fasting labs (requested by provider).

## 2020-07-11 NOTE — Progress Notes (Signed)
Left message on patient's voicemail to request call back; need to schedule cpe with fasting labs.

## 2020-07-13 NOTE — Telephone Encounter (Signed)
Tried to call pt, having connectivity issues. Pls let pt know that she will not be able to come into the office being positive for covid. Depending on the nature of the visit, the pt is able to do virtual. Please follow up with pt to make her aware.

## 2020-07-13 NOTE — Telephone Encounter (Signed)
Patient returned our call. I have moved her to 08/13/20 for a CPE.

## 2020-07-13 NOTE — Telephone Encounter (Signed)
Patient called in she was dx with Covid this morning. She feels fine however her job has taken her out of work until 07/20/2020. She is scheduled to see [place] on 07/23/2020 she wants to make sure it is okay to keep this appointment.   CB# [phone]

## 2020-07-23 ENCOUNTER — Encounter: Admitting: Nurse Practitioner

## 2020-08-03 ENCOUNTER — Emergency Department (HOSPITAL_BASED_OUTPATIENT_CLINIC_OR_DEPARTMENT_OTHER)

## 2020-08-03 ENCOUNTER — Other Ambulatory Visit: Payer: Self-pay

## 2020-08-03 ENCOUNTER — Emergency Department (HOSPITAL_BASED_OUTPATIENT_CLINIC_OR_DEPARTMENT_OTHER)
Admission: EM | Admit: 2020-08-03 | Discharge: 2020-08-03 | Disposition: A | Discharge status: Home / Self Care | Attending: Emergency Medicine | Admitting: Emergency Medicine

## 2020-08-03 ENCOUNTER — Encounter (HOSPITAL_BASED_OUTPATIENT_CLINIC_OR_DEPARTMENT_OTHER): Payer: Self-pay | Admitting: Emergency Medicine

## 2020-08-03 DIAGNOSIS — R0602 Shortness of breath: Secondary | ICD-10-CM | POA: Diagnosis present

## 2020-08-03 DIAGNOSIS — J4521 Mild intermittent asthma with (acute) exacerbation: Secondary | ICD-10-CM | POA: Diagnosis not present

## 2020-08-03 DIAGNOSIS — J45901 Unspecified asthma with (acute) exacerbation: Secondary | ICD-10-CM

## 2020-08-03 MED ORDER — IPRATROPIUM BROMIDE HFA 17 MCG/ACT IN AERS
2.0000 | INHALATION_SPRAY | Freq: Once | RESPIRATORY_TRACT | Status: AC
  Administered 2020-08-03: 2 via RESPIRATORY_TRACT
  Filled 2020-08-03: qty 12.9

## 2020-08-03 MED ORDER — PREDNISONE 50 MG PO TABS
60.0000 mg | ORAL_TABLET | Freq: Once | ORAL | Status: AC
  Administered 2020-08-03: 60 mg via ORAL
  Filled 2020-08-03: qty 1

## 2020-08-03 MED ORDER — ALBUTEROL SULFATE HFA 108 (90 BASE) MCG/ACT IN AERS
4.0000 | INHALATION_SPRAY | Freq: Once | RESPIRATORY_TRACT | Status: AC
  Administered 2020-08-03: 4 via RESPIRATORY_TRACT
  Filled 2020-08-03: qty 6.7

## 2020-08-03 MED ORDER — PREDNISONE 20 MG PO TABS: 60.0000 mg | ORAL_TABLET | Freq: Every day | ORAL | 0 refills | Status: AC

## 2020-08-03 NOTE — ED Provider Notes (Signed)
[facility] EMERGENCY DEPT Provider Note   CSN: [id number] Arrival date & time: 08/03/20  2947     History Chief Complaint  Patient presents with   Asthma   Shortness of Breath    Laura Ryan is a 55 y.o. female.  The history is provided by the patient.  Asthma This is a new problem. The current episode started 2 days ago. The problem occurs constantly. Associated symptoms include shortness of breath. Pertinent negatives include no chest pain and no abdominal pain. The symptoms are aggravated by walking. Relieved by: inhaler. Treatments tried: albuteorl. The treatment provided mild relief.  Shortness of Breath Associated symptoms: wheezing   Associated symptoms: no abdominal pain, no chest pain, no cough, no ear pain, no fever, no rash, no sore throat and no vomiting       Past Medical History:  Diagnosis Date   Asthma    Obese     Patient Active Problem List   Diagnosis Date Noted   Asthma 06/27/2016   Morbid obesity (HCC) 06/27/2016   Vitamin D deficiency 03/07/2013    History reviewed. No pertinent surgical history.   OB History   No obstetric history on file.     Family History  Problem Relation Age of Onset   Cancer Mother 16       Colon Cancer   Alcohol abuse Father     Social History   Tobacco Use   Smoking status: Never   Smokeless tobacco: Never  Substance Use Topics   Alcohol use: Yes    Comment: socially   Drug use: No    Home Medications Prior to Admission medications   Medication Sig Start Date End Date Taking? Authorizing Provider  predniSONE (DELTASONE) 20 MG tablet Take 3 tablets (60 mg total) by mouth daily for 4 days. 08/03/20 08/07/20 Yes Arda Daggs, DO  albuterol (VENTOLIN HFA) 108 (90 Base) MCG/ACT inhaler Inhale 2 puffs into the lungs every 6 (six) hours as needed for wheezing or shortness of breath. 07/05/20   Valentino Nose, NP  cetirizine (ZYRTEC) 10 MG tablet Take 1 tablet (10 mg total) by mouth daily.  07/05/20   Valentino Nose, NP  fluticasone (FLONASE) 50 MCG/ACT nasal spray Place 1 spray into both nostrils daily. 07/05/20   Valentino Nose, NP    Allergies    Patient has no known allergies.  Review of Systems   Review of Systems  Constitutional:  Negative for chills and fever.  HENT:  Negative for ear pain and sore throat.   Eyes:  Negative for pain and visual disturbance.  Respiratory:  Positive for shortness of breath and wheezing. Negative for cough.   Cardiovascular:  Negative for chest pain and palpitations.  Gastrointestinal:  Negative for abdominal pain and vomiting.  Genitourinary:  Negative for dysuria and hematuria.  Musculoskeletal:  Negative for arthralgias and back pain.  Skin:  Negative for color change and rash.  Neurological:  Negative for seizures and syncope.  All other systems reviewed and are negative.  Physical Exam Updated Vital Signs BP (!) 144/86   Pulse 82   Temp 98.5 F (36.9 C) (Oral)   Resp 20   SpO2 100%   Physical Exam Vitals and nursing note reviewed.  Constitutional:      General: She is not in acute distress.    Appearance: She is well-developed. She is not ill-appearing.  HENT:     Head: Normocephalic and atraumatic.  Eyes:     Conjunctiva/sclera: Conjunctivae  normal.  Cardiovascular:     Rate and Rhythm: Normal rate and regular rhythm.     Pulses: Normal pulses.     Heart sounds: No murmur heard. Pulmonary:     Effort: Pulmonary effort is normal. No respiratory distress.     Breath sounds: Wheezing present. No rales.  Abdominal:     Palpations: Abdomen is soft.     Tenderness: There is no abdominal tenderness.  Musculoskeletal:        General: Normal range of motion.     Cervical back: Neck supple.     Right lower leg: No edema.     Left lower leg: No edema.  Skin:    General: Skin is warm and dry.     Capillary Refill: Capillary refill takes less than 2 seconds.  Neurological:     Mental Status: She is alert.     ED Results / Procedures / Treatments   Labs (all labs ordered are listed, but only abnormal results are displayed) Labs Reviewed - No data to display  EKG EKG Interpretation  Date/Time:  Friday August 03 2020 09:29:26 EDT Ventricular Rate:  81 PR Interval:  153 QRS Duration: 84 QT Interval:  391 QTC Calculation: 454 R Axis:   56 Text Interpretation: Sinus rhythm Biatrial enlargement Confirmed by Virgina Norfolk ([id number]) on 08/03/2020 9:38:57 AM  Radiology DG Chest Portable 1 View  Result Date: 08/03/2020 CLINICAL DATA:  Cough and short of breath EXAM: PORTABLE CHEST 1 VIEW COMPARISON:  02/28/2017 FINDINGS: Heart size mildly enlarged. Pulmonary vascularity normal. Lungs clear without infiltrate or effusion. IMPRESSION: No active disease. Electronically Signed   By: Marlan Palau M.D.   On: 08/03/2020 10:06    Procedures Procedures   Medications Ordered in ED Medications  albuterol (VENTOLIN HFA) 108 (90 Base) MCG/ACT inhaler 4 puff (4 puffs Inhalation Given 08/03/20 0939)  ipratropium (ATROVENT HFA) inhaler 2 puff (2 puffs Inhalation Given 08/03/20 0939)  predniSONE (DELTASONE) tablet 60 mg (60 mg Oral Given 08/03/20 1024)    ED Course  I have reviewed the triage vital signs and the nursing notes.  Pertinent labs & imaging results that were available during my care of the patient were reviewed by me and considered in my medical decision making (see chart for details).    MDM Rules/Calculators/A&P                          Laura Ryan is here with asthma exacerbation/shortness of breath.  Normal vitals.  No fever.  No increased work of breathing.  States that she has not been able to use her air conditioning at home as some exposure to pollen over the weekend and has not done well with asthma symptoms.  Recently had COVID.  Denies any chest pain or major shortness of breath.  Only feels shortness of breath and cough when she is ambulating.  Overall she appears comfortable at rest.   She does have some wheezing throughout.  She was given albuterol, Atrovent, steroids.  Chest x-ray was done that showed no pneumonia, no pneumothorax.  She felt much better after breathing treatment.  Have no concern for ACS or PE.  EKG shows sinus rhythm.  No ischemic changes.  Overall suspect asthma exacerbation.  She states that she felt fully recovered from COVID and had been feeling fine for a week or 2.  Overall suspect that this is an asthma exacerbation in the setting of allergy/changes in weather.  She  understands return precautions.  Discharged in good condition.  This chart was dictated using voice recognition software.  Despite best efforts to proofread,  errors can occur which can change the documentation meaning.   Final Clinical Impression(s) / ED Diagnoses Final diagnoses:  Mild asthma with exacerbation, unspecified whether persistent    Rx / DC Orders ED Discharge Orders          Ordered    predniSONE (DELTASONE) 20 MG tablet  Daily        08/03/20 1029             [place], Madelaine Bhat, DO 08/03/20 1033

## 2020-08-03 NOTE — ED Triage Notes (Signed)
Pt was at work , had  difficulty with shortness of breath, took a coworkers albuterol inhaler. Pt reports her ac has been not working and heat has been making her asthma worse.

## 2020-08-03 NOTE — Discharge Instructions (Addendum)
Take your next dose of prednisone tomorrow morning.  Take as prescribed.  Recommend albuterol 4 puffs every 4 hours for the next 24 hours and then as needed.  Recommend Atrovent 2 puffs every 6 hours for the next 24 hours and then as needed.  Please return if symptoms worsen.

## 2020-08-13 ENCOUNTER — Telehealth: Payer: Self-pay

## 2020-08-13 ENCOUNTER — Ambulatory Visit (INDEPENDENT_AMBULATORY_CARE_PROVIDER_SITE_OTHER): Admitting: Nurse Practitioner

## 2020-08-13 ENCOUNTER — Telehealth: Payer: Self-pay | Admitting: Nurse Practitioner

## 2020-08-13 ENCOUNTER — Other Ambulatory Visit: Payer: Self-pay

## 2020-08-13 ENCOUNTER — Encounter: Payer: Self-pay | Admitting: Nurse Practitioner

## 2020-08-13 VITALS — BP 138/90 | HR 73 | Temp 98.4°F | Ht 63.6 in | Wt 256.6 lb

## 2020-08-13 DIAGNOSIS — Z124 Encounter for screening for malignant neoplasm of cervix: Secondary | ICD-10-CM | POA: Diagnosis not present

## 2020-08-13 DIAGNOSIS — Z0000 Encounter for general adult medical examination without abnormal findings: Secondary | ICD-10-CM

## 2020-08-13 DIAGNOSIS — Z13220 Encounter for screening for lipoid disorders: Secondary | ICD-10-CM | POA: Diagnosis not present

## 2020-08-13 DIAGNOSIS — Z136 Encounter for screening for cardiovascular disorders: Secondary | ICD-10-CM

## 2020-08-13 DIAGNOSIS — Z131 Encounter for screening for diabetes mellitus: Secondary | ICD-10-CM

## 2020-08-13 DIAGNOSIS — Z6841 Body Mass Index (BMI) 40.0 and over, adult: Secondary | ICD-10-CM | POA: Diagnosis not present

## 2020-08-13 DIAGNOSIS — Z1329 Encounter for screening for other suspected endocrine disorder: Secondary | ICD-10-CM

## 2020-08-13 LAB — COMPLETE METABOLIC PANEL WITH GFR
AG Ratio: 1.4 (calc) (ref 1.0–2.5)
ALT: 19 U/L (ref 6–29)
AST: 18 U/L (ref 10–35)
Albumin: 4.4 g/dL (ref 3.6–5.1)
Alkaline phosphatase (APISO): 81 U/L (ref 37–153)
BUN: 16 mg/dL (ref 7–25)
CO2: 30 mmol/L (ref 20–32)
Calcium: 9.7 mg/dL (ref 8.6–10.4)
Chloride: 99 mmol/L (ref 98–110)
Creat: 0.92 mg/dL (ref 0.50–1.05)
GFR, Est African American: 82 mL/min/{1.73_m2} (ref >=60)
GFR, Est Non African American: 71 mL/min/{1.73_m2} (ref >=60)
Globulin: 3.1 g/dL (calc) (ref 1.9–3.7)
Glucose, Bld: 79 mg/dL (ref 65–99)
Potassium: 4.3 mmol/L (ref 3.5–5.3)
Sodium: 139 mmol/L (ref 135–146)
Total Bilirubin: 0.6 mg/dL (ref 0.2–1.2)
Total Protein: 7.5 g/dL (ref 6.1–8.1)

## 2020-08-13 LAB — CBC WITH DIFFERENTIAL/PLATELET
Absolute Monocytes: 614 cells/uL (ref 200–950)
Basophils Absolute: 42 cells/uL (ref 0–200)
Basophils Relative: 0.5 %
Eosinophils Absolute: 332 cells/uL (ref 15–500)
Eosinophils Relative: 4 %
HCT: 41 % (ref 35.0–45.0)
Hemoglobin: 13.1 g/dL (ref 11.7–15.5)
Lymphs Abs: 1884 cells/uL (ref 850–3900)
MCH: 28.8 pg (ref 27.0–33.0)
MCHC: 32 g/dL (ref 32.0–36.0)
MCV: 90.1 fL (ref 80.0–100.0)
MPV: 13.6 fL — ABNORMAL HIGH (ref 7.5–12.5)
Monocytes Relative: 7.4 %
Neutro Abs: 5428 cells/uL (ref 1500–7800)
Neutrophils Relative %: 65.4 %
Platelets: 211 10*3/uL (ref 140–400)
RBC: 4.55 10*6/uL (ref 3.80–5.10)
RDW: 14 % (ref 11.0–15.0)
Total Lymphocyte: 22.7 %
WBC: 8.3 10*3/uL (ref 3.8–10.8)

## 2020-08-13 LAB — TSH: TSH: 2.44 mIU/L

## 2020-08-13 LAB — LIPID PANEL
Cholesterol: 229 mg/dL — ABNORMAL HIGH (ref <200)
HDL: 83 mg/dL (ref >=50)
LDL Cholesterol (Calc): 126 mg/dL (calc) — ABNORMAL HIGH
Non-HDL Cholesterol (Calc): 146 mg/dL (calc) — ABNORMAL HIGH (ref <130)
Total CHOL/HDL Ratio: 2.8 (calc) (ref <5.0)
Triglycerides: 94 mg/dL (ref <150)

## 2020-08-13 LAB — VITAMIN D 25 HYDROXY (VIT D DEFICIENCY, FRACTURES): Vit D, 25-Hydroxy: 23 ng/mL — ABNORMAL LOW (ref 30–100)

## 2020-08-13 LAB — HM PAP SMEAR: HM Pap smear: NEGATIVE

## 2020-08-13 MED ORDER — SAXENDA 18 MG/3ML ~~LOC~~ SOPN: 0.6000 mg | PEN_INJECTOR | Freq: Every day | SUBCUTANEOUS | 1 refills | Status: DC

## 2020-08-13 NOTE — Telephone Encounter (Signed)
Spoke with pt, PA is pending

## 2020-08-13 NOTE — Telephone Encounter (Signed)
Pt called and stated that she went to pharmacy to pick up new weight loss med and they are requesting a prior authorization for med. Please advise  Cb#: [phone]

## 2020-08-13 NOTE — Progress Notes (Signed)
BP 138/90   Pulse 73   Temp 98.4 F (36.9 C)   Ht 5' 3.6" (1.615 m)   Wt 256 lb 9.6 oz (116.4 kg)   SpO2 96%   BMI 44.60 kg/m    Subjective:    Patient ID: Laura RummageSheila Nieblas, female    DOB: 05/26/1965, 55 y.o.   MRN: [id number]  HPI: Laura Ryan is a 55 y.o. female presenting on 08/13/2020 for comprehensive medical examination. Current medical complaints include:  WEIGHT GAIN Duration: chronic Previous attempts at weight loss: yes Complications of obesity: not happy with weight Peak weight: 265 Weight loss goal: TBD Weight loss to date: 10 lbs  Requesting obesity pharmacotherapy: yes Current weight loss supplements/medications: Octavia - lost 30 lbs  Previous weight loss supplements/meds: Phentermine  Calories:  Physical activity: 1-2 days weekly;  Water: Drinks ~3 bottles Has not been watching diet.  Currently works night shift as LPN.  Retired from [employer].  She currently lives with: husband and son;  LMP:  has been more than 1 year since LMP.  Hot flashes sometimes , manageable  Depression Screen done today and results listed below:  [facility] 08/13/2020 04/27/2017 03/27/2017 09/22/2016 08/01/2016  Decreased Interest 0 0 0 0 0  Down, Depressed, Hopeless 0 0 0 0 0  PHQ - 2 Score 0 0 0 0 0  Altered sleeping 1 - - 0 0  Tired, decreased energy 0 - - 0 0  Change in appetite 1 - - 0 0  Feeling bad or failure about yourself  0 - - 0 0  Trouble concentrating 0 - - 0 0  Moving slowly or fidgety/restless 0 - - 0 0  Suicidal thoughts 0 - - 0 0  PHQ-9 Score 2 - - 0 0  Difficult doing work/chores Not difficult at all - - Not difficult at all Not difficult at all    The patient does not have a history of falls. I did not complete a risk assessment for falls. A plan of care for falls was not documented.   Past Medical History:  Past Medical History:  Diagnosis Date   Asthma    Obese     Surgical History:  History reviewed. No pertinent surgical  history.  Medications:  Current Outpatient Medications on File Prior to Visit  Medication Sig   albuterol (VENTOLIN HFA) 108 (90 Base) MCG/ACT inhaler Inhale 2 puffs into the lungs every 6 (six) hours as needed for wheezing or shortness of breath.   cetirizine (ZYRTEC) 10 MG tablet Take 1 tablet (10 mg total) by mouth daily.   fluticasone (FLONASE) 50 MCG/ACT nasal spray Place 1 spray into both nostrils daily.   No current facility-administered medications on file prior to visit.    Allergies:  No Known Allergies  Social History:  Social History   Socioeconomic History   Marital status: Married    Spouse name: Not on file   Number of children: Not on file   Years of education: Not on file   Highest education level: Not on file  Occupational History   Not on file  Tobacco Use   Smoking status: Never   Smokeless tobacco: Never  Substance and Sexual Activity   Alcohol use: Yes    Comment: socially   Drug use: No   Sexual activity: Yes  Other Topics Concern   Not on file  Social History Narrative   He is married. She has 3 children. They have all completed high  school her now out of the house.(as of 2015.)   She is retired from the [employer]. She works part-time at [employer]. She is in school with [employer] for nursing program.   Social Determinants of Health   Financial Resource Strain: Not on file  Food Insecurity: Not on file  Transportation Needs: Not on file  Physical Activity: Not on file  Stress: Not on file  Social Connections: Not on file  Intimate Partner Violence: Not on file   Social History   Tobacco Use  Smoking Status Never  Smokeless Tobacco Never   Social History   Substance and Sexual Activity  Alcohol Use Yes   Comment: socially    Family History:  Family History  Problem Relation Age of Onset   Cancer Mother 58       Colon Cancer   Alcohol abuse Father     Past medical history, surgical history, medications, allergies,  family history and social history reviewed with patient today and changes made to appropriate areas of the chart.   Review of Systems  Constitutional: Negative.   HENT: Negative.    Eyes: Negative.   Respiratory: Negative.    Cardiovascular: Negative.   Gastrointestinal: Negative.   Genitourinary: Negative.   Musculoskeletal: Negative.   Skin: Negative.   Neurological: Negative.   Psychiatric/Behavioral: Negative.        Objective:    BP 138/90   Pulse 73   Temp 98.4 F (36.9 C)   Ht 5' 3.6" (1.615 m)   Wt 256 lb 9.6 oz (116.4 kg)   SpO2 96%   BMI 44.60 kg/m   Wt Readings from Last 3 Encounters:  08/13/20 256 lb 9.6 oz (116.4 kg)  07/28/17 241 lb (109.3 kg)  04/27/17 253 lb (114.8 kg)    Physical Exam Vitals and nursing note reviewed. Exam conducted with a chaperone present (CS).  Constitutional:      General: She is not in acute distress.    Appearance: Normal appearance. She is obese. She is not ill-appearing or toxic-appearing.  HENT:     Head: Normocephalic and atraumatic.     Right Ear: Tympanic membrane, ear canal and external ear normal. There is no impacted cerumen.     Left Ear: Tympanic membrane, ear canal and external ear normal. There is no impacted cerumen.     Nose: Nose normal. No congestion or rhinorrhea.     Mouth/Throat:     Mouth: Mucous membranes are moist.     Pharynx: Oropharynx is clear. No oropharyngeal exudate.  Eyes:     General: No scleral icterus.    Extraocular Movements: Extraocular movements intact.     Pupils: Pupils are equal, round, and reactive to light.  Cardiovascular:     Rate and Rhythm: Normal rate and regular rhythm.     Pulses: Normal pulses.     Heart sounds: Normal heart sounds. No murmur heard. Pulmonary:     Effort: Pulmonary effort is normal. No respiratory distress.     Breath sounds: No wheezing or rhonchi.  Chest:  Breasts:    Right: Normal. No inverted nipple, mass, nipple discharge or skin change.     Left:  Normal. No inverted nipple, mass, nipple discharge or skin change.  Abdominal:     General: Abdomen is flat. Bowel sounds are normal. There is no distension.     Palpations: Abdomen is soft.     Tenderness: There is no abdominal tenderness.  Genitourinary:    General:  Normal vulva.     Exam position: Lithotomy position.     Pubic Area: No rash.      Labia:        Right: No rash, tenderness or lesion.        Left: No rash, tenderness or lesion.      Vagina: Normal.     Cervix: Normal.     Uterus: Normal.      Adnexa: Right adnexa normal and left adnexa normal.  Musculoskeletal:        General: No swelling or tenderness. Normal range of motion.     Cervical back: Normal range of motion and neck supple. No rigidity or tenderness.     Right lower leg: No edema.     Left lower leg: No edema.  Lymphadenopathy:     Lower Body: No right inguinal adenopathy. No left inguinal adenopathy.  Skin:    General: Skin is warm and dry.     Capillary Refill: Capillary refill takes less than 2 seconds.     Coloration: Skin is not jaundiced or pale.  Neurological:     General: No focal deficit present.     Mental Status: She is alert and oriented to person, place, and time.     Motor: No weakness.     Gait: Gait normal.  Psychiatric:        Mood and Affect: Mood normal.        Behavior: Behavior normal.        Thought Content: Thought content normal.        Judgment: Judgment normal.      Assessment & Plan:   Problem List Items Addressed This Visit       Other   BMI 40.0-44.9, adult (HCC)    Chronic.  Discussed dietary and lifestyle changes including increasing physical activity to 30 minutes 5 times weekly.  Try to increase dietary intake of fresh fruits and vegetables.  Start Saxdena for weight loss - 0.6 mg daily and follow up in 1 month.  Will check blood counts, kidney function with electrolytes and liver enzymes, lipid panel, and HgbA1c today to screen for diabetes.          Relevant Medications   Liraglutide -Weight Management (SAXENDA) 18 MG/3ML SOPN   Other Relevant Orders   Lipid panel   CBC with Differential/Platelet   TSH   COMPLETE METABOLIC PANEL WITH GFR   VITAMIN D 25 Hydroxy (Vit-D Deficiency, Fractures)   Other Visit Diagnoses     Annual physical exam    -  Primary   Screening for cervical cancer       Relevant Orders   PAP, Thin Prep w/HPV rflx HPV Type 16/18   Encounter for lipid screening for cardiovascular disease       Relevant Orders   Lipid panel   Screening for thyroid disorder       Relevant Orders   TSH   Screening for diabetes mellitus       Relevant Orders   Hemoglobin A1c        Follow up plan: Return in about 4 weeks (around 09/10/2020) for weight loss f/u.   LABORATORY TESTING:  - Pap smear: pap done  IMMUNIZATIONS:   - Tdap: Tetanus vaccination status reviewed: last tetanus booster within 10 years. - Influenza: Postponed to flu season - Pneumovax: Up to date - Prevnar: Up to date - HPV: Not applicable - Zostavax vaccine: Not applicable - COVID-19 vaccine: fully vaccinated with 3  doses  SCREENING: -Mammogram: Up to date  - Colonoscopy: Ordered today  - Bone Density: Not applicable  -Hearing Test: Not applicable  -Spirometry: Not applicable   PATIENT COUNSELING:   Advised to take 1 mg of folate supplement per day if capable of pregnancy.   Sexuality: Discussed sexually transmitted diseases, partner selection, use of condoms, avoidance of unintended pregnancy  and contraceptive alternatives.   Advised to avoid cigarette smoking.  I discussed with the patient that most people either abstain from alcohol or drink within safe limits (<=14/week and <=4 drinks/occasion for males, <=7/weeks and <= 3 drinks/occasion for females) and that the risk for alcohol disorders and other health effects rises proportionally with the number of drinks per week and how often a drinker exceeds daily limits.  Discussed  cessation/primary prevention of drug use and availability of treatment for abuse.   Diet: Encouraged to adjust caloric intake to maintain  or achieve ideal body weight, to reduce intake of dietary saturated fat and total fat, to limit sodium intake by avoiding high sodium foods and not adding table salt, and to maintain adequate dietary potassium and calcium preferably from fresh fruits, vegetables, and low-fat dairy products.    stressed the importance of regular exercise  Injury prevention: Discussed safety belts, safety helmets, smoke detector, smoking near bedding or upholstery.   Dental health: Discussed importance of regular tooth brushing, flossing, and dental visits.    NEXT PREVENTATIVE PHYSICAL DUE IN 1 YEAR. Return in about 4 weeks (around 09/10/2020) for weight loss f/u.

## 2020-08-13 NOTE — Telephone Encounter (Signed)
[employer] is reviewing your PA request and will respond within 24 hours for Medicaid or up to 72 hours for non-Medicaid plans, based on the required timeframe determined by state or federal regulations. 

## 2020-08-13 NOTE — Patient Instructions (Signed)
Call [facility] and make appointment for colonoscopy.

## 2020-08-13 NOTE — Assessment & Plan Note (Signed)
Chronic.  Discussed dietary and lifestyle changes including increasing physical activity to 30 minutes 5 times weekly.  Try to increase dietary intake of fresh fruits and vegetables.  Start Saxdena for weight loss - 0.6 mg daily and follow up in 1 month.  Will check blood counts, kidney function with electrolytes and liver enzymes, lipid panel, and HgbA1c today to screen for diabetes.

## 2020-08-14 LAB — HEMOGLOBIN A1C
Hgb A1c MFr Bld: 5.9 % of total Hgb — ABNORMAL HIGH (ref <5.7)
Mean Plasma Glucose: 123 mg/dL
eAG (mmol/L): 6.8 mmol/L

## 2020-08-15 LAB — PAP, TP IMAGING W/ HPV RNA, RFLX HPV TYPE 16,18/45: HPV DNA High Risk: NOT DETECTED

## 2020-08-16 NOTE — Progress Notes (Signed)
Spoke wi th pt regarding results, pt is still wanting to  try the saxenda for weight loss, willing to purchase until ins approves. Will appeal decision. 1st PA denied

## 2020-09-14 ENCOUNTER — Telehealth: Payer: Self-pay

## 2020-09-14 NOTE — Telephone Encounter (Signed)
Another PA was submitted for saxenda, 72 hr return on decision. Pt is aware of pending PA.

## 2020-09-24 ENCOUNTER — Other Ambulatory Visit: Payer: Self-pay

## 2020-09-24 NOTE — Telephone Encounter (Signed)
Pt has agreed to weight loss clinic for management. Order placed

## 2020-09-25 LAB — HM MAMMOGRAPHY

## 2021-03-27 ENCOUNTER — Emergency Department (HOSPITAL_BASED_OUTPATIENT_CLINIC_OR_DEPARTMENT_OTHER)
Admission: EM | Admit: 2021-03-27 | Discharge: 2021-03-27 | Disposition: A | Discharge status: Home / Self Care | Attending: Emergency Medicine | Admitting: Emergency Medicine

## 2021-03-27 ENCOUNTER — Emergency Department (HOSPITAL_BASED_OUTPATIENT_CLINIC_OR_DEPARTMENT_OTHER): Admitting: Radiology

## 2021-03-27 ENCOUNTER — Encounter (HOSPITAL_BASED_OUTPATIENT_CLINIC_OR_DEPARTMENT_OTHER): Payer: Self-pay | Admitting: Emergency Medicine

## 2021-03-27 ENCOUNTER — Other Ambulatory Visit: Payer: Self-pay

## 2021-03-27 DIAGNOSIS — J45909 Unspecified asthma, uncomplicated: Secondary | ICD-10-CM | POA: Insufficient documentation

## 2021-03-27 DIAGNOSIS — I493 Ventricular premature depolarization: Secondary | ICD-10-CM

## 2021-03-27 DIAGNOSIS — J208 Acute bronchitis due to other specified organisms: Secondary | ICD-10-CM | POA: Insufficient documentation

## 2021-03-27 DIAGNOSIS — D72829 Elevated white blood cell count, unspecified: Secondary | ICD-10-CM | POA: Insufficient documentation

## 2021-03-27 DIAGNOSIS — Z20822 Contact with and (suspected) exposure to covid-19: Secondary | ICD-10-CM | POA: Diagnosis not present

## 2021-03-27 DIAGNOSIS — R0602 Shortness of breath: Secondary | ICD-10-CM | POA: Diagnosis present

## 2021-03-27 LAB — BASIC METABOLIC PANEL
Anion gap: 8 (ref 5–15)
BUN: 15 mg/dL (ref 6–20)
CO2: 31 mmol/L (ref 22–32)
Calcium: 9.5 mg/dL (ref 8.9–10.3)
Chloride: 100 mmol/L (ref 98–111)
Creatinine, Ser: 0.99 mg/dL (ref 0.44–1.00)
GFR, Estimated: >60 mL/min (ref >60)
Glucose, Bld: 83 mg/dL (ref 70–99)
Potassium: 3.9 mmol/L (ref 3.5–5.1)
Sodium: 139 mmol/L (ref 135–145)

## 2021-03-27 LAB — CBC WITH DIFFERENTIAL/PLATELET
Abs Immature Granulocytes: 0.03 10*3/uL (ref 0.00–0.07)
Basophils Absolute: 0.1 10*3/uL (ref 0.0–0.1)
Basophils Relative: 0 %
Eosinophils Absolute: 0.3 10*3/uL (ref 0.0–0.5)
Eosinophils Relative: 3 %
HCT: 40.2 % (ref 36.0–46.0)
Hemoglobin: 12.6 g/dL (ref 12.0–15.0)
Immature Granulocytes: 0 %
Lymphocytes Relative: 18 %
Lymphs Abs: 2 10*3/uL (ref 0.7–4.0)
MCH: 28.7 pg (ref 26.0–34.0)
MCHC: 31.3 g/dL (ref 30.0–36.0)
MCV: 91.6 fL (ref 80.0–100.0)
Monocytes Absolute: 1 10*3/uL (ref 0.1–1.0)
Monocytes Relative: 8 %
Neutro Abs: 8.2 10*3/uL — ABNORMAL HIGH (ref 1.7–7.7)
Neutrophils Relative %: 71 %
Platelets: 170 10*3/uL (ref 150–400)
RBC: 4.39 MIL/uL (ref 3.87–5.11)
RDW: 14.8 % (ref 11.5–15.5)
WBC: 11.6 10*3/uL — ABNORMAL HIGH (ref 4.0–10.5)
nRBC: 0 % (ref 0.0–0.2)

## 2021-03-27 LAB — RESP PANEL BY RT-PCR (FLU A&B, COVID) ARPGX2
Influenza A by PCR: NEGATIVE
Influenza B by PCR: NEGATIVE
SARS Coronavirus 2 by RT PCR: NEGATIVE

## 2021-03-27 MED ORDER — PREDNISONE 10 MG (21) PO TBPK
ORAL_TABLET | ORAL | 0 refills | Status: DC
  Filled 2021-03-27: qty 1, fill #0

## 2021-03-27 MED ORDER — PREDNISONE 10 MG (21) PO TBPK: ORAL_TABLET | ORAL | 0 refills | Status: DC

## 2021-03-27 MED ORDER — IPRATROPIUM-ALBUTEROL 0.5-2.5 (3) MG/3ML IN SOLN
3.0000 mL | Freq: Once | RESPIRATORY_TRACT | Status: AC
  Administered 2021-03-27: 3 mL via RESPIRATORY_TRACT
  Filled 2021-03-27: qty 3

## 2021-03-27 MED ORDER — PREDNISONE 50 MG PO TABS
60.0000 mg | ORAL_TABLET | Freq: Once | ORAL | Status: AC
  Administered 2021-03-27: 60 mg via ORAL
  Filled 2021-03-27: qty 1

## 2021-03-27 NOTE — ED Provider Notes (Signed)
[facility] EMERGENCY DEPT  Provider Note  CSN: [id number] Arrival date & time: 03/27/21 1146  History Chief Complaint  Patient presents with   Shortness of Breath    Laura Ryan is a 56 y.o. female with history of asthma and no other medical problems reports 2 days of increasing SOB, wheezing, cough productive of brown sputum and nasal congestion. She has been using her inhaler some with minimal improvement. Denies CP, fever, N/V/D, no lightheadedness or dizziness.    Home Medications Prior to Admission medications   Medication Sig Start Date End Date Taking? Authorizing Provider  predniSONE (STERAPRED UNI-PAK 21 TAB) 10 MG (21) TBPK tablet 10mg  Tabs, 6 day taper. Use as directed 03/27/21  Yes 05/25/21, MD  albuterol (VENTOLIN HFA) 108 (90 Base) MCG/ACT inhaler Inhale 2 puffs into the lungs every 6 (six) hours as needed for wheezing or shortness of breath. 07/05/20   07/07/20, NP  cetirizine (ZYRTEC) 10 MG tablet Take 1 tablet (10 mg total) by mouth daily. 07/05/20   07/07/20, NP  fluticasone (FLONASE) 50 MCG/ACT nasal spray Place 1 spray into both nostrils daily. 07/05/20   07/07/20, NP  Liraglutide -Weight Management (SAXENDA) 18 MG/3ML SOPN Inject 0.6 mg into the skin daily. 08/13/20   08/15/20, NP     Allergies    Patient has no known allergies.   Review of Systems   Review of Systems Please see HPI for pertinent positives and negatives  Physical Exam BP 120/61    Pulse 72    Temp 98.8 F (37.1 C) (Oral)    Resp 18    Wt 113.4 kg    SpO2 96%    BMI 43.45 kg/m   Physical Exam Vitals and nursing note reviewed.  Constitutional:      Appearance: Normal appearance.  HENT:     Head: Normocephalic and atraumatic.     Nose: Nose normal.     Mouth/Throat:     Mouth: Mucous membranes are moist.  Eyes:     Extraocular Movements: Extraocular movements intact.     Conjunctiva/sclera: Conjunctivae normal.   Cardiovascular:     Rate and Rhythm: Normal rate.  Pulmonary:     Effort: Pulmonary effort is normal.     Breath sounds: Wheezing present.     Comments: Patient sleeping soundly with snoring on my initial evaluation, moderate hypoxia while asleep, recovers to normal quickly upon waking.  Abdominal:     General: Abdomen is flat.     Palpations: Abdomen is soft.     Tenderness: There is no abdominal tenderness.  Musculoskeletal:        General: No swelling. Normal range of motion.     Cervical back: Neck supple.  Skin:    General: Skin is warm and dry.  Neurological:     General: No focal deficit present.     Mental Status: She is alert.  Psychiatric:        Mood and Affect: Mood normal.    ED Results / Procedures / Treatments   EKG EKG Interpretation  Date/Time:  Wednesday March 27 2021 11:57:24 EST Ventricular Rate:  89 PR Interval:  148 QRS Duration: 76 QT Interval:  392 QTC Calculation: 476 R Axis:   56 Text Interpretation: Sinus rhythm with sinus arrhythmia with frequent Premature ventricular complexes Right atrial enlargement Borderline ECG When compared with ECG of 03-Aug-2020 09:29, No significant change since last tracing similar appearance of PVCs Confirmed  by Susy Frizzle [phone]) on 03/27/2021 3:31:23 PM  Procedures Procedures  Medications Ordered in the ED Medications  ipratropium-albuterol (DUONEB) 0.5-2.5 (3) MG/3ML nebulizer solution 3 mL (3 mLs Nebulization Given 03/27/21 1553)  predniSONE (DELTASONE) tablet 60 mg (60 mg Oral Given 03/27/21 1551)    Initial Impression and Plan  Patient here with URI and wheezing. Noted to be hypoxic while asleep but no known history of OSA. She was also noted to have frequent PVCs on Cardiac Monitor. Similar to EKG from last year although she is not aware that she has ever had this problem. Will check basic labs, give prednisone and duoneb. CXR and Covid are both negative.   ED Course   Clinical Course as of 03/27/21  1802  Wed Mar 27, 2021  1623 CBC with mild leukocytosis, otherwise normal.  [CS]  1641 BMP is normal.  [CS]  1800 Patient feeling better after neb. Will plan discharge home with continued home inhaler, Rx for prednisone. Recommend close PCP follow up for recheck and to follow her PVCs when she is feeling well. She is currently not having any symptomatic concerns regarding her PVCs.  [CS]    Clinical Course User Index [CS] Pollyann Savoy, MD     MDM Rules/Calculators/A&P Medical Decision Making Problems Addressed: PVC's (premature ventricular contractions): undiagnosed new problem with uncertain prognosis Viral bronchitis: acute illness or injury that poses a threat to life or bodily functions  Amount and/or Complexity of Data Reviewed Labs: ordered. Decision-making details documented in ED Course. Radiology: ordered and independent interpretation performed. Decision-making details documented in ED Course. ECG/medicine tests: ordered and independent interpretation performed. Decision-making details documented in ED Course.  Risk Prescription drug management. Decision regarding hospitalization.    Final Clinical Impression(s) / ED Diagnoses Final diagnoses:  Viral bronchitis  PVC's (premature ventricular contractions)    Rx / DC Orders ED Discharge Orders          Ordered    predniSONE (STERAPRED UNI-PAK 21 TAB) 10 MG (21) TBPK tablet        03/27/21 1802             Pollyann Savoy, MD 03/27/21 [phone]

## 2021-03-27 NOTE — ED Triage Notes (Signed)
Presents for SOB, congestion, HA, productive cough x 2 days. H/o asthma.

## 2021-03-28 ENCOUNTER — Other Ambulatory Visit (HOSPITAL_BASED_OUTPATIENT_CLINIC_OR_DEPARTMENT_OTHER): Payer: Self-pay

## 2021-04-22 DIAGNOSIS — Z0289 Encounter for other administrative examinations: Secondary | ICD-10-CM

## 2021-05-01 ENCOUNTER — Ambulatory Visit (INDEPENDENT_AMBULATORY_CARE_PROVIDER_SITE_OTHER): Payer: Commercial Managed Care - PPO | Admitting: Bariatrics

## 2021-05-01 ENCOUNTER — Other Ambulatory Visit: Payer: Self-pay

## 2021-05-01 ENCOUNTER — Encounter (INDEPENDENT_AMBULATORY_CARE_PROVIDER_SITE_OTHER): Payer: Self-pay | Admitting: Bariatrics

## 2021-05-01 VITALS — BP 140/69 | HR 64 | Temp 98.2°F | Ht 62.0 in | Wt 247.0 lb

## 2021-05-01 DIAGNOSIS — R0602 Shortness of breath: Secondary | ICD-10-CM

## 2021-05-01 DIAGNOSIS — Z1331 Encounter for screening for depression: Secondary | ICD-10-CM | POA: Diagnosis not present

## 2021-05-01 DIAGNOSIS — E559 Vitamin D deficiency, unspecified: Secondary | ICD-10-CM | POA: Diagnosis not present

## 2021-05-01 DIAGNOSIS — E7800 Pure hypercholesterolemia, unspecified: Secondary | ICD-10-CM

## 2021-05-01 DIAGNOSIS — R7303 Prediabetes: Secondary | ICD-10-CM

## 2021-05-01 DIAGNOSIS — R7309 Other abnormal glucose: Secondary | ICD-10-CM

## 2021-05-01 DIAGNOSIS — E668 Other obesity: Secondary | ICD-10-CM

## 2021-05-01 DIAGNOSIS — Z6841 Body Mass Index (BMI) 40.0 and over, adult: Secondary | ICD-10-CM

## 2021-05-01 DIAGNOSIS — R5383 Other fatigue: Secondary | ICD-10-CM | POA: Diagnosis not present

## 2021-05-01 DIAGNOSIS — J4540 Moderate persistent asthma, uncomplicated: Secondary | ICD-10-CM

## 2021-05-02 ENCOUNTER — Encounter (INDEPENDENT_AMBULATORY_CARE_PROVIDER_SITE_OTHER): Payer: Self-pay | Admitting: Bariatrics

## 2021-05-02 LAB — VITAMIN D 25 HYDROXY (VIT D DEFICIENCY, FRACTURES): Vit D, 25-Hydroxy: 19.4 ng/mL — ABNORMAL LOW (ref 30.0–100.0)

## 2021-05-02 LAB — LIPID PANEL WITH LDL/HDL RATIO
Cholesterol, Total: 205 mg/dL — ABNORMAL HIGH (ref 100–199)
HDL: 70 mg/dL (ref >39)
LDL Chol Calc (NIH): 122 mg/dL — ABNORMAL HIGH (ref 0–99)
LDL/HDL Ratio: 1.7 ratio (ref 0.0–3.2)
Triglycerides: 71 mg/dL (ref 0–149)
VLDL Cholesterol Cal: 13 mg/dL (ref 5–40)

## 2021-05-02 LAB — TSH+T4F+T3FREE
Free T4: 1 ng/dL (ref 0.82–1.77)
T3, Free: 3.5 pg/mL (ref 2.0–4.4)
TSH: 3.1 u[IU]/mL (ref 0.450–4.500)

## 2021-05-02 LAB — HEMOGLOBIN A1C
Est. average glucose Bld gHb Est-mCnc: 134 mg/dL
Hgb A1c MFr Bld: 6.3 % — ABNORMAL HIGH (ref 4.8–5.6)

## 2021-05-02 LAB — INSULIN, RANDOM: INSULIN: 6.8 u[IU]/mL (ref 2.6–24.9)

## 2021-05-06 ENCOUNTER — Encounter (INDEPENDENT_AMBULATORY_CARE_PROVIDER_SITE_OTHER): Payer: Self-pay | Admitting: Bariatrics

## 2021-05-06 NOTE — Progress Notes (Signed)
Chief Complaint:   OBESITY Laura Ryan (MR# [id number]) is a 56 y.o. female who presents for evaluation and treatment of obesity and related comorbidities. Current BMI is Body mass index is 45.18 kg/m. [name] has been struggling with her weight for many years and has been unsuccessful in either losing weight, maintaining weight loss, or reaching her healthy weight goal.  [name] states that she likes to cook sometimes and denies obstacles. She craves breads and sweets.   Gray is currently in the action stage of change and ready to dedicate time achieving and maintaining a healthier weight. [name] is interested in becoming our patient and working on intensive lifestyle modifications including (but not limited to) diet and exercise for weight loss.  [name]'s habits were reviewed today and are as follows: Her family eats meals together, her desired weight loss is 72 pounds, she started gaining weight after retiring from the [employer] in 2006, her heaviest weight ever was 270 pounds, she has significant food cravings issues, she snacks frequently in the evenings, she is frequently drinking liquids with calories, she frequently makes poor food choices, she frequently eats larger portions than normal, and she struggles with emotional eating.  Depression Screen [name]'s Food and Mood (modified PHQ-9) score was 6.  Depression screen PHQ 2/9 05/01/2021  Decreased Interest 1  Down, Depressed, Hopeless 1  PHQ - 2 Score 2  Altered sleeping 0  Tired, decreased energy 1  Change in appetite 2  Feeling bad or failure about yourself  0  Trouble concentrating 1  Moving slowly or fidgety/restless 0  Suicidal thoughts 0  PHQ-9 Score 6  Difficult doing work/chores Not difficult at all   Subjective:   1. Other fatigue [name] will continue activities. [name] denies daytime somnolence and denies waking up still tired. Patient has a history of symptoms of morning headache. [name] generally gets  6-8   hours of sleep per night, and states that she has generally restful sleep. Snoring is present. Apneic episodes are not present. Epworth Sleepiness Score is 10.    2. Shortness of breath on exertion [name] will continue activities. [name] notes increasing shortness of breath with exercising and seems to be worsening over time with weight gain. She notes getting out of breath sooner with activity than she used to. This has not gotten worse recently. [name] denies shortness of breath at rest or orthopnea.   3. High cholesterol [name] is not on medications currently.   4. Vitamin D deficiency [name] does not always take her Vitamin D.   5. Moderate persistent asthma without complication [name] uses her inhaler once weekly.   6. Pre-diabetes Copeland is currently not on medications.   Assessment/Plan:   1. Other fatigue [name] will gradually increase activities and exercise. We will check thyroid panel today. [name] does feel that her weight is causing her energy to be lower than it should be. Fatigue may be related to obesity, depression or many other causes. Labs will be ordered, and in the meanwhile, [name] will focus on self care including making healthy food choices, increasing physical activity and focusing on stress reduction.   - TSH+T4F+T3Free  2. Shortness of breath on exertion [name] will gradually increase activities and exercise. We will check thyroid panel today. [name] does feel that she gets out of breath more easily that she used to when she exercises. [name]'s shortness of breath appears to be obesity related and exercise induced. She has agreed to work on weight loss and gradually increase  exercise to treat her exercise induced shortness of breath. Will continue to monitor closely.   - TSH+T4F+T3Free  3. High cholesterol Cardiovascular risk and specific lipid/LDL goals reviewed.  [name] will have no trans fats, she will increase MUFAs. We will check Lipid panel today. We  discussed several lifestyle modifications today and [name] will continue to work on diet, exercise and weight loss efforts. Orders and follow up as documented in patient record.   Counseling Intensive lifestyle modifications are the first line treatment for this issue. Dietary changes: Increase soluble fiber. Decrease simple carbohydrates. Exercise changes: Moderate to vigorous-intensity aerobic activity 150 minutes per week if tolerated. Lipid-lowering medications: see documented in medical record.  - Lipid Panel With LDL/HDL Ratio  4. Vitamin D deficiency Low Vitamin D level contributes to fatigue and are associated with obesity, breast, and colon cancer. We will check Vitamin D today and [name] will follow-up for routine testing of Vitamin D, at least 2-3 times per year to avoid over-replacement.  - VITAMIN D 25 Hydroxy (Vit-D Deficiency, Fractures)  5. Moderate persistent asthma without complication [name] will use her inhaler as directed.   6. Pre-diabetes We will check insulin and A1C today. [name] will continue to work on weight loss, exercise, and decreasing simple carbohydrates to help decrease the risk of diabetes.   - Insulin, random - Hemoglobin A1c  7. Depression screen [name] had a positive depression screening. Depression is commonly associated with obesity and often results in emotional eating behaviors. We will monitor this closely and work on CBT to help improve the non-hunger eating patterns. Referral to Psychology may be required if no improvement is seen as she continues in our clinic.   8. Class 3 severe obesity with serious comorbidity and body mass index (BMI) of 45.0 to 49.9 in adult, unspecified obesity type [facility]) [name] is currently in the action stage of change and her goal is to continue with weight loss efforts. I recommend [name] begin the structured treatment plan as follows:  She has agreed to the Category 1 Plan.  [name] will continue meal planning.  We reviewed labs from 03/27/2021 BMP, CBC, glucose, A1C was 5.9 on 08/13/2020. She will decrease portion sizes. She will have no sugary drinks.   Exercise goals: No exercise has been prescribed at this time.   Behavioral modification strategies: increasing lean protein intake, decreasing simple carbohydrates, increasing vegetables, increasing water intake, decreasing eating out, no skipping meals, meal planning and cooking strategies, keeping healthy foods in the home, and planning for success.  She was informed of the importance of frequent follow-up visits to maximize her success with intensive lifestyle modifications for her multiple health conditions. She was informed we would discuss her lab results at her next visit unless there is a critical issue that needs to be addressed sooner. Tinya agreed to keep her next visit at the agreed upon time to discuss these results.  Objective:   Blood pressure 140/69, pulse 64, temperature 98.2 F (36.8 C), height 5\' 2"  (1.575 m), weight 247 lb (112 kg), SpO2 100 %. Body mass index is 45.18 kg/m.  EKG: Normal sinus rhythm, rate 89 bpm.  Indirect Calorimeter completed today shows a VO2 of 201 and a REE of 1382.  Her calculated basal metabolic rate is 4540 thus her basal metabolic rate is worse than expected.  General: Cooperative, alert, well developed, in no acute distress. HEENT: Conjunctivae and lids unremarkable. Cardiovascular: Regular rhythm.  Lungs: Normal work of breathing. Neurologic: No focal deficits.  Lab Results  Component Value Date   CREATININE 0.99 03/27/2021   BUN 15 03/27/2021   NA 139 03/27/2021   K 3.9 03/27/2021   CL 100 03/27/2021   CO2 31 03/27/2021   Lab Results  Component Value Date   ALT 19 08/13/2020   AST 18 08/13/2020   ALKPHOS 69 09/22/2016   BILITOT 0.6 08/13/2020   Lab Results  Component Value Date   HGBA1C 6.3 (H) 05/01/2021   HGBA1C 5.9 (H) 08/13/2020   Lab Results  Component Value Date   INSULIN  6.8 05/01/2021   Lab Results  Component Value Date   TSH 3.100 05/01/2021   Lab Results  Component Value Date   CHOL 205 (H) 05/01/2021   HDL 70 05/01/2021   LDLCALC 122 (H) 05/01/2021   TRIG 71 05/01/2021   CHOLHDL 2.8 08/13/2020   Lab Results  Component Value Date   WBC 11.6 (H) 03/27/2021   HGB 12.6 03/27/2021   HCT 40.2 03/27/2021   MCV 91.6 03/27/2021   PLT 170 03/27/2021   No results found for: IRON, TIBC, FERRITIN  Attestation Statements:   Reviewed by clinician on day of visit: allergies, medications, problem list, medical history, surgical history, family history, social history, and previous encounter notes.  I, Jackson Latino, RMA, am acting as Energy manager for [employer], DO.  I have reviewed the above documentation for accuracy and completeness, and I agree with the above. Corinna Capra, DO

## 2021-05-15 ENCOUNTER — Other Ambulatory Visit (INDEPENDENT_AMBULATORY_CARE_PROVIDER_SITE_OTHER): Payer: Self-pay | Admitting: Bariatrics

## 2021-05-15 ENCOUNTER — Other Ambulatory Visit: Payer: Self-pay

## 2021-05-15 ENCOUNTER — Encounter (INDEPENDENT_AMBULATORY_CARE_PROVIDER_SITE_OTHER): Payer: Self-pay | Admitting: Bariatrics

## 2021-05-15 ENCOUNTER — Ambulatory Visit (INDEPENDENT_AMBULATORY_CARE_PROVIDER_SITE_OTHER): Payer: Commercial Managed Care - PPO | Admitting: Bariatrics

## 2021-05-15 VITALS — BP 136/84 | HR 78 | Temp 98.1°F | Ht 62.0 in | Wt 247.0 lb

## 2021-05-15 DIAGNOSIS — R030 Elevated blood-pressure reading, without diagnosis of hypertension: Secondary | ICD-10-CM | POA: Diagnosis not present

## 2021-05-15 DIAGNOSIS — R7303 Prediabetes: Secondary | ICD-10-CM

## 2021-05-15 DIAGNOSIS — E7800 Pure hypercholesterolemia, unspecified: Secondary | ICD-10-CM | POA: Diagnosis not present

## 2021-05-15 DIAGNOSIS — E559 Vitamin D deficiency, unspecified: Secondary | ICD-10-CM

## 2021-05-15 DIAGNOSIS — R632 Polyphagia: Secondary | ICD-10-CM | POA: Diagnosis not present

## 2021-05-15 DIAGNOSIS — Z6841 Body Mass Index (BMI) 40.0 and over, adult: Secondary | ICD-10-CM

## 2021-05-15 DIAGNOSIS — E669 Obesity, unspecified: Secondary | ICD-10-CM

## 2021-05-15 MED ORDER — WEGOVY 0.25 MG/0.5ML ~~LOC~~ SOAJ: 0.2500 mg | SUBCUTANEOUS | 0 refills | Status: DC

## 2021-05-15 MED ORDER — VITAMIN D (ERGOCALCIFEROL) 1.25 MG (50000 UNIT) PO CAPS: 50000.0000 [IU] | ORAL_CAPSULE | ORAL | 0 refills | Status: DC

## 2021-05-15 NOTE — Progress Notes (Addendum)
? ? ? ?Chief Complaint:  ? ?OBESITY ?Laura Ryan is here to discuss her progress with her obesity treatment plan along with follow-up of her obesity related diagnoses. Laura Ryan is on the Category 1 Plan and states she is following her eating plan approximately 10% of the time. Laura Ryan states she is not currently exercising. ? ?Today's visit was #: 2 ?Starting weight: 247 lbs ?Starting date: 05/01/2021 ?Today's weight: 247 lb ?Today's date: 05/15/2021 ?Total lbs lost to date: 0 lbs ?Total lbs lost since last in-office visit: 0 lbs ? ?Interim History: Laura Ryan's weight remains the same, as she has been very busy and limited on the time she could concentrated on the plan. She is currently taking Saxenda. ? ?Subjective:  ? ?1. Pre-diabetes ?Laura Ryan is not on specific medications. She was prescribed Saxenda but her insurance did not cover.  Her current A1C  is 6.3.  ? ?2. High cholesterol ?Laura Ryan is currently not on medications.  ? ?3. Elevated blood pressure reading ?Laura Ryan is not currently taking any medication. She does not have a diagnosis of high blood pressure. Her blood pressure today was 136/84. ? ?4. Polyphagia ?Laura Ryan reports having an irregular work schedule that interferes with her eating. ? ?5. Vitamin D deficiency ?Laura Ryan is currently taking Vitamin D.  ? ? ?Assessment/Plan:  ? ?1. Pre-diabetes ?Laura Ryan will minimize carbohydrates (sweets and starches).   ? ?2. High cholesterol ?Laura Ryan agreed to no trans fats and limit saturated fats, except unprocessed meat, dark chocolate, and dairy. ? ?3. Elevated blood pressure reading ?Laura Ryan will follow up with her blood pressure at next visit. She will have no salt added.  ? ?4. Polyphagia ?We will refill Wegovy 0.25 mg for 1 month with no refills. Laura Ryan has agreed to taking the following medication and will follow up as directed. ? ?- Semaglutide-Weight Management (WEGOVY) 0.25 MG/0.5ML SOAJ; Inject 0.25 mg into the skin once a week.  Dispense: 2 mL; Refill: 0 ? ?5. Vitamin D  deficiency ?Low Vitamin D level contributes to fatigue and are associated with obesity, breast, and colon cancer. We will refill prescription Vitamin D 50,000 IU every week for 1 month with no refills and Laura Ryan will follow-up for routine testing of Vitamin D, at least 2-3 times per year to avoid over-replacement. ? ?- Vitamin D, Ergocalciferol, (DRISDOL) 1.25 MG (50000 UNIT) CAPS capsule; Take 1 capsule (50,000 Units total) by mouth every 7 (seven) days.  Dispense: 4 capsule; Refill: 0 ? ?6. Obesity, current BMI 45.2 ?Laura Ryan is currently in the action stage of change. As such, her goal is to continue with weight loss efforts. She has agreed to the Category 1 Plan.  ? ?Laura Ryan has agreed to adhere to the plan (80 - 90%), and mindful eating.  Labs (A1c, insulin, vit d, thyroid) were reviewed with Laura Ryan. ? ?Exercise goals: Will begin walking and use the treadmill. ? ?Behavioral modification strategies: increasing lean protein intake, decreasing simple carbohydrates, increasing vegetables, increasing water intake, decreasing eating out, no skipping meals, meal planning and cooking strategies, keeping healthy foods in the home, and planning for success. ? ?Laura Ryan has agreed to follow-up with our clinic in 2 weeks with Laura Limbo, FNP or Laura Salvage, FNP. She was informed of the importance of frequent follow-up visits to maximize her success with intensive lifestyle modifications for her multiple health conditions.  ? ?Objective:  ? ?Blood pressure 136/84, pulse 78, temperature 98.1 ?F (36.7 ?C), height 5\' 2"  (1.575 m), weight 247 lb (112 kg), SpO2 99 %. ?Body mass index  is 45.18 kg/m?. ? ?General: Cooperative, alert, well developed, in no acute distress. ?HEENT: Conjunctivae and lids unremarkable. ?Cardiovascular: Regular rhythm.  ?Lungs: Normal work of breathing. ?Neurologic: No focal deficits.  ? ?Lab Results  ?Component Value Date  ? CREATININE 0.99 03/27/2021  ? BUN 15 03/27/2021  ? NA 139 03/27/2021  ?  K 3.9 03/27/2021  ? CL 100 03/27/2021  ? CO2 31 03/27/2021  ? ?Lab Results  ?Component Value Date  ? ALT 19 08/13/2020  ? AST 18 08/13/2020  ? ALKPHOS 69 09/22/2016  ? BILITOT 0.6 08/13/2020  ? ?Lab Results  ?Component Value Date  ? HGBA1C 6.3 (H) 05/01/2021  ? HGBA1C 5.9 (H) 08/13/2020  ? ?Lab Results  ?Component Value Date  ? INSULIN 6.8 05/01/2021  ? ?Lab Results  ?Component Value Date  ? TSH 3.100 05/01/2021  ? ?Lab Results  ?Component Value Date  ? CHOL 205 (H) 05/01/2021  ? HDL 70 05/01/2021  ? LDLCALC 122 (H) 05/01/2021  ? TRIG 71 05/01/2021  ? CHOLHDL 2.8 08/13/2020  ? ?Lab Results  ?Component Value Date  ? VD25OH 19.4 (L) 05/01/2021  ? VD25OH 23 (L) 08/13/2020  ? VD25OH 23 (L) 09/22/2016  ? ?Lab Results  ?Component Value Date  ? WBC 11.6 (H) 03/27/2021  ? HGB 12.6 03/27/2021  ? HCT 40.2 03/27/2021  ? MCV 91.6 03/27/2021  ? PLT 170 03/27/2021  ? ?No results found for: IRON, TIBC, FERRITIN ? ?Attestation Statements:  ? ?Reviewed by clinician on day of visit: allergies, medications, problem list, medical history, surgical history, family history, social history, and previous encounter notes. ? ?I, Laura Ryan, am acting as Energy manager for [employer], DO. ? ?I have reviewed the above documentation for accuracy and completeness, and I agree with the above. Laura Capra, DO  ?

## 2021-05-16 ENCOUNTER — Encounter (INDEPENDENT_AMBULATORY_CARE_PROVIDER_SITE_OTHER): Payer: Self-pay | Admitting: Bariatrics

## 2021-05-21 ENCOUNTER — Encounter (INDEPENDENT_AMBULATORY_CARE_PROVIDER_SITE_OTHER): Payer: Self-pay | Admitting: Bariatrics

## 2021-05-23 ENCOUNTER — Encounter (INDEPENDENT_AMBULATORY_CARE_PROVIDER_SITE_OTHER): Payer: Self-pay

## 2021-05-23 ENCOUNTER — Encounter (INDEPENDENT_AMBULATORY_CARE_PROVIDER_SITE_OTHER): Payer: Self-pay | Admitting: Bariatrics

## 2021-05-23 NOTE — Telephone Encounter (Signed)
Please review

## 2021-06-03 ENCOUNTER — Ambulatory Visit (INDEPENDENT_AMBULATORY_CARE_PROVIDER_SITE_OTHER): Payer: Commercial Managed Care - PPO | Admitting: Physician Assistant

## 2021-06-17 ENCOUNTER — Ambulatory Visit (INDEPENDENT_AMBULATORY_CARE_PROVIDER_SITE_OTHER): Payer: Commercial Managed Care - PPO | Admitting: Bariatrics

## 2021-06-24 ENCOUNTER — Ambulatory Visit (INDEPENDENT_AMBULATORY_CARE_PROVIDER_SITE_OTHER): Payer: Commercial Managed Care - PPO | Admitting: Bariatrics

## 2021-06-24 ENCOUNTER — Other Ambulatory Visit (INDEPENDENT_AMBULATORY_CARE_PROVIDER_SITE_OTHER): Payer: Self-pay | Admitting: Bariatrics

## 2021-06-24 ENCOUNTER — Encounter (INDEPENDENT_AMBULATORY_CARE_PROVIDER_SITE_OTHER): Payer: Self-pay | Admitting: Bariatrics

## 2021-06-24 VITALS — BP 139/83 | HR 43 | Temp 98.1°F | Ht 62.0 in | Wt 250.0 lb

## 2021-06-24 DIAGNOSIS — R7303 Prediabetes: Secondary | ICD-10-CM

## 2021-06-24 DIAGNOSIS — E65 Localized adiposity: Secondary | ICD-10-CM

## 2021-06-24 DIAGNOSIS — E669 Obesity, unspecified: Secondary | ICD-10-CM

## 2021-06-24 DIAGNOSIS — Z6841 Body Mass Index (BMI) 40.0 and over, adult: Secondary | ICD-10-CM

## 2021-06-24 DIAGNOSIS — E559 Vitamin D deficiency, unspecified: Secondary | ICD-10-CM

## 2021-06-24 MED ORDER — METFORMIN HCL 500 MG PO TABS: 500.0000 mg | ORAL_TABLET | Freq: Every day | ORAL | 0 refills | Status: DC

## 2021-06-24 MED ORDER — VITAMIN D (ERGOCALCIFEROL) 1.25 MG (50000 UNIT) PO CAPS: 50000.0000 [IU] | ORAL_CAPSULE | ORAL | 0 refills | Status: DC

## 2021-06-30 NOTE — Progress Notes (Signed)
Chief Complaint:   OBESITY Laura Ryan is here to discuss her progress with her obesity treatment plan along with follow-up of her obesity related diagnoses. Laura Ryan is on the Category 1 Plan and states she is following her eating plan approximately 0% of the time. Laura Ryan states she is doing 0 minutes 0 times per week.  Today's visit was #: 3 Starting weight: 247 lbs Starting date: 05/01/2021 Today's weight: 250 lbs Today's date: 06/24/2021 Total lbs lost to date: 0 Total lbs lost since last in-office visit: 0  Interim History: Laura Ryan is up 3 lbs since her last visit. She has been on a cruise. She is drinking more water.   Subjective:   1. Vitamin D deficiency Laura Ryan is taking Vitamin D currently.   2. Visceral obesity Laura Ryan's current value equal 17.  3. Pre-diabetes Laura Ryan is not on medications currently.  Assessment/Plan:   1. Vitamin D deficiency Low Vitamin D level contributes to fatigue and are associated with obesity, breast, and colon cancer. We will refill prescription Vitamin D 50,000 IU every week for 1 month with no refills and Laura Ryan will follow-up for routine testing of Vitamin D, at least 2-3 times per year to avoid over-replacement.  - Vitamin D, Ergocalciferol, (DRISDOL) 1.25 MG (50000 UNIT) CAPS capsule; Take 1 capsule (50,000 Units total) by mouth every 7 (seven) days.  Dispense: 4 capsule; Refill: 0  2. Visceral obesity Laura Ryan's goal is 13 or lower. She will continue with the plan and exercise.   3.  Pre-diabetes Laura Ryan will continue to work on weight loss, exercise, and decreasing simple carbohydrates to help decrease the risk of diabetes. Laura Ryan agrees to start Metformin 500 mg for 1 month with no refills.   - metFORMIN (GLUCOPHAGE) 500 MG tablet; Take 1 tablet (500 mg total) by mouth daily.  Dispense: 30 tablet; Refill: 0  3. Obesity, current BMI 45.7 Laura Ryan is currently in the action stage of change. As such, her goal is to continue with weight loss  efforts. She has agreed to the Category 1 Plan.   Laura Ryan will continue meal planning and she will continue intentional eating.   Exercise goals:  Laura Ryan will get back to exercise and walking on the treadmill.  Behavioral modification strategies: increasing lean protein intake, decreasing simple carbohydrates, increasing vegetables, increasing water intake, decreasing eating out, no skipping meals, meal planning and cooking strategies, keeping healthy foods in the home, and planning for success.  Laura Ryan has agreed to follow-up with our clinic in 3 weeks with a nurse practitioner and 6 weeks with myself. She was informed of the importance of frequent follow-up visits to maximize her success with intensive lifestyle modifications for her multiple health conditions.   Objective:   Blood pressure 139/83, pulse (!) 43, temperature 98.1 F (36.7 C), height 5\' 2"  (1.575 m), weight 250 lb (113.4 kg), SpO2 95 %. Body mass index is 45.73 kg/m.  General: Cooperative, alert, well developed, in no acute distress. HEENT: Conjunctivae and lids unremarkable. Cardiovascular: Regular rhythm.  Lungs: Normal work of breathing. Neurologic: No focal deficits.   Lab Results  Component Value Date   CREATININE 0.99 03/27/2021   BUN 15 03/27/2021   NA 139 03/27/2021   K 3.9 03/27/2021   CL 100 03/27/2021   CO2 31 03/27/2021   Lab Results  Component Value Date   ALT 19 08/13/2020   AST 18 08/13/2020   ALKPHOS 69 09/22/2016   BILITOT 0.6 08/13/2020   Lab Results  Component Value Date  HGBA1C 6.3 (H) 05/01/2021   HGBA1C 5.9 (H) 08/13/2020   Lab Results  Component Value Date   INSULIN 6.8 05/01/2021   Lab Results  Component Value Date   TSH 3.100 05/01/2021   Lab Results  Component Value Date   CHOL 205 (H) 05/01/2021   HDL 70 05/01/2021   LDLCALC 122 (H) 05/01/2021   TRIG 71 05/01/2021   CHOLHDL 2.8 08/13/2020   Lab Results  Component Value Date   VD25OH 19.4 (L) 05/01/2021   VD25OH  23 (L) 08/13/2020   VD25OH 23 (L) 09/22/2016   Lab Results  Component Value Date   WBC 11.6 (H) 03/27/2021   HGB 12.6 03/27/2021   HCT 40.2 03/27/2021   MCV 91.6 03/27/2021   PLT 170 03/27/2021   No results found for: IRON, TIBC, FERRITIN  Attestation Statements:   Reviewed by clinician on day of visit: allergies, medications, problem list, medical history, surgical history, family history, social history, and previous encounter notes.  I, Jackson Latino, RMA, am acting as Energy manager for [employer], DO.  I have reviewed the above documentation for accuracy and completeness, and I agree with the above. Corinna Capra, DO

## 2021-07-11 ENCOUNTER — Encounter (INDEPENDENT_AMBULATORY_CARE_PROVIDER_SITE_OTHER): Payer: Self-pay | Admitting: Bariatrics

## 2021-07-22 ENCOUNTER — Ambulatory Visit (INDEPENDENT_AMBULATORY_CARE_PROVIDER_SITE_OTHER): Payer: Commercial Managed Care - PPO | Admitting: Adult Health

## 2021-08-05 ENCOUNTER — Ambulatory Visit (INDEPENDENT_AMBULATORY_CARE_PROVIDER_SITE_OTHER): Payer: Commercial Managed Care - PPO | Admitting: Bariatrics

## 2021-08-05 ENCOUNTER — Other Ambulatory Visit (INDEPENDENT_AMBULATORY_CARE_PROVIDER_SITE_OTHER): Payer: Self-pay | Admitting: Bariatrics

## 2021-08-05 ENCOUNTER — Encounter (INDEPENDENT_AMBULATORY_CARE_PROVIDER_SITE_OTHER): Payer: Self-pay | Admitting: Bariatrics

## 2021-08-05 VITALS — BP 136/65 | HR 63 | Temp 97.9°F | Ht 62.0 in | Wt 260.0 lb

## 2021-08-05 DIAGNOSIS — R7303 Prediabetes: Secondary | ICD-10-CM

## 2021-08-05 DIAGNOSIS — Z7984 Long term (current) use of oral hypoglycemic drugs: Secondary | ICD-10-CM

## 2021-08-05 DIAGNOSIS — Z6841 Body Mass Index (BMI) 40.0 and over, adult: Secondary | ICD-10-CM

## 2021-08-05 DIAGNOSIS — E65 Localized adiposity: Secondary | ICD-10-CM

## 2021-08-05 DIAGNOSIS — E559 Vitamin D deficiency, unspecified: Secondary | ICD-10-CM

## 2021-08-05 DIAGNOSIS — E668 Other obesity: Secondary | ICD-10-CM

## 2021-08-05 MED ORDER — VITAMIN D (ERGOCALCIFEROL) 1.25 MG (50000 UNIT) PO CAPS: 50000.0000 [IU] | ORAL_CAPSULE | ORAL | 0 refills | Status: DC

## 2021-08-05 MED ORDER — PHENTERMINE HCL 15 MG PO CAPS: 15.0000 mg | ORAL_CAPSULE | Freq: Two times a day (BID) | ORAL | 0 refills | Status: DC

## 2021-08-05 MED ORDER — METFORMIN HCL 500 MG PO TABS: 500.0000 mg | ORAL_TABLET | Freq: Every day | ORAL | 0 refills | Status: DC

## 2021-08-06 NOTE — Progress Notes (Signed)
Chief Complaint:   OBESITY Laura Ryan is here to discuss her progress with her obesity treatment plan along with follow-up of her obesity related diagnoses. [name] is on the Category 1 Plan and states she is following her eating plan approximately 0% of the time. [name] states she is walking/on the elliptical for 25 minutes 2 times per week.  Today's visit was #: 4 Starting weight: 247 lbs Starting date: 05/01/2021 Today's weight: 260 lbs Today's date: 08/05/2021 Total lbs lost to date: 0 Total lbs lost since last in-office visit: 0  Interim History: [name] was up 10 pounds since her last visit.  She denies any cardiac issues to in the morning.  She is up about 8 pounds in water weight.  Subjective:   1. Vitamin D deficiency [name] is taking vitamin D as directed.  2. Visceral obesity [name]'s previous visceral fat rating was 17, but has now gone up to 18.  3. Pre-diabetes [name] is taking metformin, and her last A1c was 6.3.  Assessment/Plan:   1. Vitamin D deficiency [name] will continue prescription vitamin D once weekly, and we will refill for 1 month.  - Vitamin D, Ergocalciferol, (DRISDOL) 1.25 MG (50000 UNIT) CAPS capsule; Take 1 capsule (50,000 Units total) by mouth every 7 (seven) days.  Dispense: 4 capsule; Refill: 0  2. Visceral obesity [name] will hair closely to her plan 90-100%.  - metFORMIN (GLUCOPHAGE) 500 MG tablet; Take 1 tablet (500 mg total) by mouth daily.  Dispense: 30 tablet; Refill: 0  3. Pre-diabetes [name] will continue metformin, and we will refill for 1 month.  - metFORMIN (GLUCOPHAGE) 500 MG tablet; Take 1 tablet (500 mg total) by mouth daily.  Dispense: 30 tablet; Refill: 0  4. Obesity, current BMI 47.6 [name] is currently in the action stage of change. As such, her goal is to continue with weight loss efforts. She has agreed to the Category 1 Plan.   Meal planning.  Intentional eating.  Increase water.  We discussed various medication  options to help [name] with her weight loss efforts and we both agreed to start phentermine 15 mg twice daily, with no refills.  She will take in the evening and at night when she is working.  Agreement for phentermine was signed and in chart.  - phentermine 15 MG capsule; Take 1 capsule (15 mg total) by mouth 2 (two) times daily. Will take in the evening and at night when she is working.  Dispense: 60 capsule; Refill: 0  Exercise goals: As is.  Behavioral modification strategies: increasing lean protein intake, decreasing simple carbohydrates, increasing vegetables, increasing water intake, decreasing eating out, no skipping meals, meal planning and cooking strategies, keeping healthy foods in the home, and planning for success.  [name] has agreed to follow-up with our clinic in 2 weeks. She was informed of the importance of frequent follow-up visits to maximize her success with intensive lifestyle modifications for her multiple health conditions.   Objective:   Blood pressure 136/65, pulse 63, temperature 97.9 F (36.6 C), height 5\' 2"  (1.575 m), weight 260 lb (117.9 kg), SpO2 96 %. Body mass index is 47.55 kg/m.  General: Cooperative, alert, well developed, in no acute distress. HEENT: Conjunctivae and lids unremarkable. Cardiovascular: Regular rhythm.  Lungs: Normal work of breathing. Neurologic: No focal deficits.   Lab Results  Component Value Date   CREATININE 0.99 03/27/2021   BUN 15 03/27/2021   NA 139 03/27/2021   K 3.9 03/27/2021   CL 100  03/27/2021   CO2 31 03/27/2021   Lab Results  Component Value Date   ALT 19 08/13/2020   AST 18 08/13/2020   ALKPHOS 69 09/22/2016   BILITOT 0.6 08/13/2020   Lab Results  Component Value Date   HGBA1C 6.3 (H) 05/01/2021   HGBA1C 5.9 (H) 08/13/2020   Lab Results  Component Value Date   INSULIN 6.8 05/01/2021   Lab Results  Component Value Date   TSH 3.100 05/01/2021   Lab Results  Component Value Date   CHOL 205 (H)  05/01/2021   HDL 70 05/01/2021   LDLCALC 122 (H) 05/01/2021   TRIG 71 05/01/2021   CHOLHDL 2.8 08/13/2020   Lab Results  Component Value Date   VD25OH 19.4 (L) 05/01/2021   VD25OH 23 (L) 08/13/2020   VD25OH 23 (L) 09/22/2016   Lab Results  Component Value Date   WBC 11.6 (H) 03/27/2021   HGB 12.6 03/27/2021   HCT 40.2 03/27/2021   MCV 91.6 03/27/2021   PLT 170 03/27/2021   No results found for: "IRON", "TIBC", "FERRITIN"  Attestation Statements:   Reviewed by clinician on day of visit: allergies, medications, problem list, medical history, surgical history, family history, social history, and previous encounter notes.   Trude Mcburney, am acting as Energy manager for [employer], DO.  I have reviewed the above documentation for accuracy and completeness, and I agree with the above. Corinna Capra, DO

## 2021-08-10 ENCOUNTER — Encounter (INDEPENDENT_AMBULATORY_CARE_PROVIDER_SITE_OTHER): Payer: Self-pay | Admitting: Bariatrics

## 2021-08-30 ENCOUNTER — Other Ambulatory Visit (INDEPENDENT_AMBULATORY_CARE_PROVIDER_SITE_OTHER): Payer: Self-pay | Admitting: Bariatrics

## 2021-08-30 DIAGNOSIS — E559 Vitamin D deficiency, unspecified: Secondary | ICD-10-CM

## 2021-08-30 DIAGNOSIS — R7303 Prediabetes: Secondary | ICD-10-CM

## 2021-08-30 DIAGNOSIS — E65 Localized adiposity: Secondary | ICD-10-CM

## 2021-09-02 ENCOUNTER — Encounter (INDEPENDENT_AMBULATORY_CARE_PROVIDER_SITE_OTHER): Payer: Self-pay | Admitting: Bariatrics

## 2021-09-02 ENCOUNTER — Ambulatory Visit (INDEPENDENT_AMBULATORY_CARE_PROVIDER_SITE_OTHER): Payer: Commercial Managed Care - PPO | Admitting: Bariatrics

## 2021-09-02 ENCOUNTER — Other Ambulatory Visit (INDEPENDENT_AMBULATORY_CARE_PROVIDER_SITE_OTHER): Payer: Self-pay | Admitting: Bariatrics

## 2021-09-02 VITALS — BP 119/84 | HR 77 | Temp 97.7°F | Ht 62.0 in | Wt 247.0 lb

## 2021-09-02 DIAGNOSIS — Z6841 Body Mass Index (BMI) 40.0 and over, adult: Secondary | ICD-10-CM | POA: Diagnosis not present

## 2021-09-02 DIAGNOSIS — E559 Vitamin D deficiency, unspecified: Secondary | ICD-10-CM

## 2021-09-02 DIAGNOSIS — E668 Other obesity: Secondary | ICD-10-CM

## 2021-09-02 DIAGNOSIS — R7303 Prediabetes: Secondary | ICD-10-CM | POA: Diagnosis not present

## 2021-09-02 DIAGNOSIS — E65 Localized adiposity: Secondary | ICD-10-CM

## 2021-09-02 MED ORDER — METFORMIN HCL 500 MG PO TABS: 500.0000 mg | ORAL_TABLET | Freq: Every day | ORAL | 0 refills | Status: DC

## 2021-09-02 MED ORDER — PHENTERMINE HCL 15 MG PO CAPS: 15.0000 mg | ORAL_CAPSULE | Freq: Two times a day (BID) | ORAL | 0 refills | Status: DC

## 2021-09-02 MED ORDER — VITAMIN D (ERGOCALCIFEROL) 1.25 MG (50000 UNIT) PO CAPS: 50000.0000 [IU] | ORAL_CAPSULE | ORAL | 0 refills | Status: DC

## 2021-09-03 ENCOUNTER — Encounter (INDEPENDENT_AMBULATORY_CARE_PROVIDER_SITE_OTHER): Payer: Self-pay

## 2021-09-03 ENCOUNTER — Telehealth (INDEPENDENT_AMBULATORY_CARE_PROVIDER_SITE_OTHER): Payer: Self-pay | Admitting: Bariatrics

## 2021-09-03 NOTE — Telephone Encounter (Signed)
Dr. Manson Passey - Prior authorization approved for Phentermine. Effective: 08/03/2021 - 12/01/2021. Patient sent approval message via mychart.

## 2021-09-03 NOTE — Progress Notes (Signed)
Chief Complaint:   OBESITY Laura Ryan is here to discuss her progress with her obesity treatment plan along with follow-up of her obesity related diagnoses. Laura Ryan is on the Category 1 Plan and states she is following her eating plan approximately 75% of the time. Laura Ryan states she is exercising at the gym and doing home exercise for 35-40 minutes 4-5 times per week.  Today's visit was #: 5 Starting weight: 247 lbs Starting date: 05/01/2021 Today's weight: 247 lbs Today's date: 09/02/2021 Total lbs lost to date: 0 Total lbs lost since last in-office visit: 13  Interim History: Laura Ryan is down 13 pounds and she has not been eating bread or sweets.  Subjective:   1. Vitamin D deficiency Laura Ryan is taking Vitamin D.   2. Visceral obesity Laura Ryan's visceral fat rating is 16 and her goal is <13.  3. Pre-diabetes Laura Ryan's appetite is normal.   Assessment/Plan:   1. Vitamin D deficiency We will refill prescription Vitamin D for 1 month. Laura Ryan will follow-up for routine testing of Vitamin D, at least 2-3 times per year to avoid over-replacement.  - Vitamin D, Ergocalciferol, (DRISDOL) 1.25 MG (50000 UNIT) CAPS capsule; Take 1 capsule (50,000 Units total) by mouth every 7 (seven) days.  Dispense: 4 capsule; Refill: 0  2. Visceral obesity Bird will continue her meal plan and exercise.   3. Pre-diabetes We will refill metformin for 1 month. Laura Ryan will continue to work on weight loss, exercise, and decreasing simple carbohydrates to help decrease the risk of diabetes.   - metFORMIN (GLUCOPHAGE) 500 MG tablet; Take 1 tablet (500 mg total) by mouth daily.  Dispense: 30 tablet; Refill: 0  4. Obesity, current BMI 45.2 Laura Ryan is currently in the action stage of change. As such, her goal is to continue with weight loss efforts. She has agreed to the Category 1 Plan.   Meal planning and intentional eating were discussed. We will recheck fasting labs at her next visit.  We discussed  various medication options to help Laura Ryan with her weight loss efforts and we both agreed to continue phentermine 15 mg twice daily, and we will refill for 1 month.  - phentermine 15 MG capsule; Take 1 capsule (15 mg total) by mouth 2 (two) times daily. Will take in the evening and at night when she is working.  Dispense: 60 capsule; Refill: 0  Exercise goals: As is.  Behavioral modification strategies: increasing lean protein intake, decreasing simple carbohydrates, increasing vegetables, increasing water intake, decreasing eating out, no skipping meals, meal planning and cooking strategies, keeping healthy foods in the home, and planning for success.  Laura Ryan has agreed to follow-up with our clinic in 3 weeks. She was informed of the importance of frequent follow-up visits to maximize her success with intensive lifestyle modifications for her multiple health conditions.   Objective:   Blood pressure 119/84, pulse 77, temperature 97.7 F (36.5 C), height 5\' 2"  (1.575 m), weight 247 lb (112 kg), SpO2 96 %. Body mass index is 45.18 kg/m.  General: Cooperative, alert, well developed, in no acute distress. HEENT: Conjunctivae and lids unremarkable. Cardiovascular: Regular rhythm.  Lungs: Normal work of breathing. Neurologic: No focal deficits.   Lab Results  Component Value Date   CREATININE 0.99 03/27/2021   BUN 15 03/27/2021   NA 139 03/27/2021   K 3.9 03/27/2021   CL 100 03/27/2021   CO2 31 03/27/2021   Lab Results  Component Value Date   ALT 19 08/13/2020   AST  18 08/13/2020   ALKPHOS 69 09/22/2016   BILITOT 0.6 08/13/2020   Lab Results  Component Value Date   HGBA1C 6.3 (H) 05/01/2021   HGBA1C 5.9 (H) 08/13/2020   Lab Results  Component Value Date   INSULIN 6.8 05/01/2021   Lab Results  Component Value Date   TSH 3.100 05/01/2021   Lab Results  Component Value Date   CHOL 205 (H) 05/01/2021   HDL 70 05/01/2021   LDLCALC 122 (H) 05/01/2021   TRIG 71 05/01/2021    CHOLHDL 2.8 08/13/2020   Lab Results  Component Value Date   VD25OH 19.4 (L) 05/01/2021   VD25OH 23 (L) 08/13/2020   VD25OH 23 (L) 09/22/2016   Lab Results  Component Value Date   WBC 11.6 (H) 03/27/2021   HGB 12.6 03/27/2021   HCT 40.2 03/27/2021   MCV 91.6 03/27/2021   PLT 170 03/27/2021   No results found for: "IRON", "TIBC", "FERRITIN"  Attestation Statements:   Reviewed by clinician on day of visit: allergies, medications, problem list, medical history, surgical history, family history, social history, and previous encounter notes.   Trude Mcburney, am acting as Energy manager for [employer], DO.  I have reviewed the above documentation for accuracy and completeness, and I agree with the above. Corinna Capra, DO

## 2021-09-04 ENCOUNTER — Other Ambulatory Visit (INDEPENDENT_AMBULATORY_CARE_PROVIDER_SITE_OTHER): Payer: Self-pay | Admitting: Bariatrics

## 2021-09-04 ENCOUNTER — Encounter (INDEPENDENT_AMBULATORY_CARE_PROVIDER_SITE_OTHER): Payer: Self-pay | Admitting: Bariatrics

## 2021-09-04 ENCOUNTER — Telehealth (INDEPENDENT_AMBULATORY_CARE_PROVIDER_SITE_OTHER): Payer: Self-pay

## 2021-09-04 DIAGNOSIS — R7303 Prediabetes: Secondary | ICD-10-CM

## 2021-09-04 DIAGNOSIS — E559 Vitamin D deficiency, unspecified: Secondary | ICD-10-CM

## 2021-09-04 MED ORDER — PHENTERMINE HCL 15 MG PO CAPS: 15.0000 mg | ORAL_CAPSULE | Freq: Two times a day (BID) | ORAL | 0 refills | Status: DC

## 2021-09-04 MED ORDER — VITAMIN D (ERGOCALCIFEROL) 1.25 MG (50000 UNIT) PO CAPS: 50000.0000 [IU] | ORAL_CAPSULE | ORAL | 0 refills | Status: DC

## 2021-09-04 MED ORDER — METFORMIN HCL 500 MG PO TABS: 500.0000 mg | ORAL_TABLET | Freq: Every day | ORAL | 0 refills | Status: DC

## 2021-09-04 NOTE — Telephone Encounter (Signed)
Left message for patient to return call.

## 2021-09-05 ENCOUNTER — Encounter (INDEPENDENT_AMBULATORY_CARE_PROVIDER_SITE_OTHER): Payer: Self-pay | Admitting: Bariatrics

## 2021-09-06 ENCOUNTER — Other Ambulatory Visit (INDEPENDENT_AMBULATORY_CARE_PROVIDER_SITE_OTHER): Payer: Self-pay | Admitting: Bariatrics

## 2021-09-06 DIAGNOSIS — R7303 Prediabetes: Secondary | ICD-10-CM

## 2021-09-25 ENCOUNTER — Encounter (INDEPENDENT_AMBULATORY_CARE_PROVIDER_SITE_OTHER): Payer: Self-pay

## 2021-10-01 LAB — HM MAMMOGRAPHY

## 2021-10-07 ENCOUNTER — Other Ambulatory Visit (INDEPENDENT_AMBULATORY_CARE_PROVIDER_SITE_OTHER): Payer: Self-pay | Admitting: Bariatrics

## 2021-10-07 ENCOUNTER — Ambulatory Visit (INDEPENDENT_AMBULATORY_CARE_PROVIDER_SITE_OTHER): Payer: Commercial Managed Care - PPO | Admitting: Bariatrics

## 2021-10-07 ENCOUNTER — Encounter (INDEPENDENT_AMBULATORY_CARE_PROVIDER_SITE_OTHER): Payer: Self-pay | Admitting: Bariatrics

## 2021-10-07 VITALS — BP 142/89 | HR 75 | Temp 97.8°F | Ht 62.0 in | Wt 245.0 lb

## 2021-10-07 DIAGNOSIS — E7800 Pure hypercholesterolemia, unspecified: Secondary | ICD-10-CM | POA: Diagnosis not present

## 2021-10-07 DIAGNOSIS — R7303 Prediabetes: Secondary | ICD-10-CM | POA: Diagnosis not present

## 2021-10-07 DIAGNOSIS — E559 Vitamin D deficiency, unspecified: Secondary | ICD-10-CM | POA: Diagnosis not present

## 2021-10-07 DIAGNOSIS — E669 Obesity, unspecified: Secondary | ICD-10-CM | POA: Diagnosis not present

## 2021-10-07 DIAGNOSIS — Z6841 Body Mass Index (BMI) 40.0 and over, adult: Secondary | ICD-10-CM

## 2021-10-07 MED ORDER — METFORMIN HCL 500 MG PO TABS: 500.0000 mg | ORAL_TABLET | Freq: Every day | ORAL | 0 refills | Status: DC

## 2021-10-07 MED ORDER — PHENTERMINE HCL 15 MG PO CAPS: 15.0000 mg | ORAL_CAPSULE | Freq: Two times a day (BID) | ORAL | 0 refills | Status: DC

## 2021-10-07 MED ORDER — VITAMIN D (ERGOCALCIFEROL) 1.25 MG (50000 UNIT) PO CAPS: 50000.0000 [IU] | ORAL_CAPSULE | ORAL | 0 refills | Status: DC

## 2021-10-08 ENCOUNTER — Encounter: Payer: Self-pay | Admitting: Family Medicine

## 2021-10-08 LAB — COMPREHENSIVE METABOLIC PANEL
ALT: 25 IU/L (ref 0–32)
AST: 22 IU/L (ref 0–40)
Albumin/Globulin Ratio: 1.7 (ref 1.2–2.2)
Albumin: 4.6 g/dL (ref 3.8–4.9)
Alkaline Phosphatase: 84 IU/L (ref 44–121)
BUN/Creatinine Ratio: 17 (ref 9–23)
BUN: 16 mg/dL (ref 6–24)
Bilirubin Total: 0.4 mg/dL (ref 0.0–1.2)
CO2: 24 mmol/L (ref 20–29)
Calcium: 9.4 mg/dL (ref 8.7–10.2)
Chloride: 101 mmol/L (ref 96–106)
Creatinine, Ser: 0.93 mg/dL (ref 0.57–1.00)
Globulin, Total: 2.7 g/dL (ref 1.5–4.5)
Glucose: 91 mg/dL (ref 70–99)
Potassium: 4.5 mmol/L (ref 3.5–5.2)
Sodium: 141 mmol/L (ref 134–144)
Total Protein: 7.3 g/dL (ref 6.0–8.5)
eGFR: 73 mL/min/{1.73_m2} (ref >59)

## 2021-10-08 LAB — VITAMIN D 25 HYDROXY (VIT D DEFICIENCY, FRACTURES): Vit D, 25-Hydroxy: 48 ng/mL (ref 30.0–100.0)

## 2021-10-08 LAB — HEMOGLOBIN A1C
Est. average glucose Bld gHb Est-mCnc: 131 mg/dL
Hgb A1c MFr Bld: 6.2 % — ABNORMAL HIGH (ref 4.8–5.6)

## 2021-10-08 LAB — LIPID PANEL WITH LDL/HDL RATIO
Cholesterol, Total: 206 mg/dL — ABNORMAL HIGH (ref 100–199)
HDL: 63 mg/dL (ref >39)
LDL Chol Calc (NIH): 133 mg/dL — ABNORMAL HIGH (ref 0–99)
LDL/HDL Ratio: 2.1 ratio (ref 0.0–3.2)
Triglycerides: 57 mg/dL (ref 0–149)
VLDL Cholesterol Cal: 10 mg/dL (ref 5–40)

## 2021-10-08 LAB — INSULIN, RANDOM: INSULIN: 14.2 u[IU]/mL (ref 2.6–24.9)

## 2021-10-09 NOTE — Progress Notes (Unsigned)
Chief Complaint:   OBESITY Laura Ryan is here to discuss her progress with her obesity treatment plan along with follow-up of her obesity related diagnoses. Laura Ryan is on the Category 2 Plan and states she is following her eating plan approximately 70% of the time. Laura Ryan states she is exercising at the gym and walking for 25 to 30 minutes 3 times per week.  Today's visit was #: 6 Starting weight: 247 lbs Starting date: 05/01/2021 Today's weight: 245 lbs Today's date: 10/07/21 Total lbs lost to date: 2 Total lbs lost since last in-office visit: -2  Interim History: She is down an additional 2 pounds since her last visit.  She is trying to get her protein and water.  Subjective:   1. Pre-diabetes Taking metformin as directed.  2. Vitamin D deficiency Taking vitamin D as directed.  3. Elevated cholesterol No medication.   Assessment/Plan:   1. Pre-diabetes Refill - metFORMIN (GLUCOPHAGE) 500 MG tablet; Take 1 tablet (500 mg total) by mouth daily.  Dispense: 30 tablet; Refill: 0 Check labs. - Insulin, random - Hemoglobin A1c - Comprehensive metabolic panel  2. Vitamin D deficiency Refill - Vitamin D, Ergocalciferol, (DRISDOL) 1.25 MG (50000 UNIT) CAPS capsule; Take 1 capsule (50,000 Units total) by mouth every 7 (seven) days.  Dispense: 4 capsule; Refill: 0 Vitamin D level. - VITAMIN D 25 Hydroxy (Vit-D Deficiency, Fractures)  3. Elevated cholesterol Lipids. - Lipid Panel With LDL/HDL Ratio - Comprehensive metabolic panel  4. Obesity, current BMI 44.9 1. Will adhere to the plan 2.  Refill phentermine 15 MG capsule; Take 1 capsule (15 mg total) by mouth 2 (two) times daily. Will take in the evening and at night when she is working.  Dispense: 60 capsule; Refill: 0  Laura Ryan is currently in the action stage of change. As such, her goal is to continue with weight loss efforts. She has agreed to the Category 2 Plan.   Exercise goals: as is.  Behavioral modification  strategies: increasing lean protein intake, decreasing simple carbohydrates, increasing vegetables, increasing water intake, decreasing eating out, no skipping meals, meal planning and cooking strategies, keeping healthy foods in the home, and planning for success.  Laura Ryan has agreed to follow-up with our clinic in 2-3 weeks with Dr. Cathey Endow. She was informed of the importance of frequent follow-up visits to maximize her success with intensive lifestyle modifications for her multiple health conditions.   Laura Ryan was informed we would discuss her lab results at her next visit unless there is a critical issue that needs to be addressed sooner. Laura Ryan agreed to keep her next visit at the agreed upon time to discuss these results.  Objective:   Blood pressure (!) 142/89, pulse 75, temperature 97.8 F (36.6 C), height 5\' 2"  (1.575 m), weight 245 lb (111.1 kg), SpO2 96 %. Body mass index is 44.81 kg/m.  General: Cooperative, alert, well developed, in no acute distress. HEENT: Conjunctivae and lids unremarkable. Cardiovascular: Regular rhythm.  Lungs: Normal work of breathing. Neurologic: No focal deficits.   Lab Results  Component Value Date   CREATININE 0.93 10/07/2021   BUN 16 10/07/2021   NA 141 10/07/2021   K 4.5 10/07/2021   CL 101 10/07/2021   CO2 24 10/07/2021   Lab Results  Component Value Date   ALT 25 10/07/2021   AST 22 10/07/2021   ALKPHOS 84 10/07/2021   BILITOT 0.4 10/07/2021   Lab Results  Component Value Date   HGBA1C 6.2 (H) 10/07/2021  HGBA1C 6.3 (H) 05/01/2021   HGBA1C 5.9 (H) 08/13/2020   Lab Results  Component Value Date   INSULIN 14.2 10/07/2021   INSULIN 6.8 05/01/2021   Lab Results  Component Value Date   TSH 3.100 05/01/2021   Lab Results  Component Value Date   CHOL 206 (H) 10/07/2021   HDL 63 10/07/2021   LDLCALC 133 (H) 10/07/2021   TRIG 57 10/07/2021   CHOLHDL 2.8 08/13/2020   Lab Results  Component Value Date   VD25OH 48.0 10/07/2021    VD25OH 19.4 (L) 05/01/2021   VD25OH 23 (L) 08/13/2020   Lab Results  Component Value Date   WBC 11.6 (H) 03/27/2021   HGB 12.6 03/27/2021   HCT 40.2 03/27/2021   MCV 91.6 03/27/2021   PLT 170 03/27/2021   No results found for: "IRON", "TIBC", "FERRITIN"   Attestation Statements:   Reviewed by clinician on day of visit: allergies, medications, problem list, medical history, surgical history, family history, social history, and previous encounter notes.  I, Dawn Whitmire, FNP-C, am acting as transcriptionist for Dr. Corinna Capra.  I have reviewed the above documentation for accuracy and completeness, and I agree with the above. Corinna Capra, DO

## 2021-10-10 ENCOUNTER — Encounter (INDEPENDENT_AMBULATORY_CARE_PROVIDER_SITE_OTHER): Payer: Self-pay | Admitting: Bariatrics

## 2021-10-25 ENCOUNTER — Ambulatory Visit (INDEPENDENT_AMBULATORY_CARE_PROVIDER_SITE_OTHER): Payer: Commercial Managed Care - PPO | Admitting: Family Medicine

## 2021-10-25 VITALS — BP 124/82 | HR 83 | Temp 98.6°F | Ht 63.0 in | Wt 249.0 lb

## 2021-10-25 DIAGNOSIS — Z0000 Encounter for general adult medical examination without abnormal findings: Secondary | ICD-10-CM | POA: Diagnosis not present

## 2021-10-25 DIAGNOSIS — E7800 Pure hypercholesterolemia, unspecified: Secondary | ICD-10-CM

## 2021-10-25 DIAGNOSIS — E65 Localized adiposity: Secondary | ICD-10-CM

## 2021-10-25 DIAGNOSIS — Z1211 Encounter for screening for malignant neoplasm of colon: Secondary | ICD-10-CM

## 2021-10-25 DIAGNOSIS — R7303 Prediabetes: Secondary | ICD-10-CM

## 2021-10-25 NOTE — Progress Notes (Signed)
Subjective:    Patient ID: Laura Ryan, female    DOB: 01-Oct-1965, 56 y.o.   MRN: [id number]  HPI  Patient is a very pleasant 56 year old African-American female who presents today to establish care.  Past medical history significant for prediabetes.  She has an elevated BMI at 44.  [facility]  They recently drew blood work including hemoglobin A1c that was 6.2.  They also checked her fasting lipid panel.  Have included her most recent lab work below for my reference. Abstract on 10/08/2021  Component Date Value Ref Range Status   HM Mammogram 10/01/2021 0-4 Bi-Rad  0-4 Bi-Rad, Self Reported Normal Final   impression-Negative  Office Visit on 10/07/2021  Component Date Value Ref Range Status   Vit D, 25-Hydroxy 10/07/2021 48.0  30.0 - 100.0 ng/mL Final   Comment: Vitamin D deficiency has been defined by the [place] practice guideline as a level of serum 25-OH vitamin D less than 20 ng/mL (1,2). The Endocrine Society went on to further define vitamin D insufficiency as a level between 21 and 29 ng/mL (2). 1. IOM (Institute of Medicine). 2010. Dietary reference    intakes for calcium and D. [place]: The    [employer]. 2. Holick MF, [place], Bischoff-Ferrari HA, et al.    Evaluation, treatment, and prevention of vitamin D    deficiency: an Endocrine Society clinical practice    guideline. JCEM. 2011 Jul; 96(7):1911-30.    Cholesterol, Total 10/07/2021 206 (H)  100 - 199 mg/dL Final   Triglycerides 10/07/2021 57  0 - 149 mg/dL Final   HDL 10/07/2021 63  >39 mg/dL Final   VLDL Cholesterol Cal 10/07/2021 10  5 - 40 mg/dL Final   LDL Chol Calc (NIH) 10/07/2021 133 (H)  0 - 99 mg/dL Final   LDL/HDL Ratio 10/07/2021 2.1  0.0 - 3.2 ratio Final   Comment:                                     LDL/HDL Ratio                                             Men  Women                                1/2 Avg.Risk  1.0    1.5                                   Avg.Risk  3.6    3.2                                2X Avg.Risk  6.2    5.0                                3X Avg.Risk  8.0    6.1    INSULIN 10/07/2021 14.2  2.6 - 24.9 uIU/mL Final   Hgb A1c MFr Bld 10/07/2021  6.2 (H)  4.8 - 5.6 % Final   Comment:          Prediabetes: 5.7 - 6.4          Diabetes: >6.4          Glycemic control for adults with diabetes: <7.0    Est. average glucose Bld gHb Est-m* 10/07/2021 131  mg/dL Final   Glucose 10/07/2021 91  70 - 99 mg/dL Final   BUN 10/07/2021 16  6 - 24 mg/dL Final   Creatinine, Ser 10/07/2021 0.93  0.57 - 1.00 mg/dL Final   eGFR 10/07/2021 73  >59 mL/min/1.73 Final   BUN/Creatinine Ratio 10/07/2021 17  9 - 23 Final   Sodium 10/07/2021 141  134 - 144 mmol/L Final   Potassium 10/07/2021 4.5  3.5 - 5.2 mmol/L Final   Chloride 10/07/2021 101  96 - 106 mmol/L Final   CO2 10/07/2021 24  20 - 29 mmol/L Final   Calcium 10/07/2021 9.4  8.7 - 10.2 mg/dL Final   Total Protein 10/07/2021 7.3  6.0 - 8.5 g/dL Final   Albumin 10/07/2021 4.6  3.8 - 4.9 g/dL Final   Globulin, Total 10/07/2021 2.7  1.5 - 4.5 g/dL Final   Albumin/Globulin Ratio 10/07/2021 1.7  1.2 - 2.2 Final   Bilirubin Total 10/07/2021 0.4  0.0 - 1.2 mg/dL Final   Alkaline Phosphatase 10/07/2021 84  44 - 121 IU/L Final   AST 10/07/2021 22  0 - 40 IU/L Final   ALT 10/07/2021 25  0 - 32 IU/L Final   Patient is currently in prediabetes and assisted weight loss.  Her last colonoscopy was in 2018.  She is due to repeat it this year and she would like me to schedule her for this.  Her last Pap smear was in June of last year and is up-to-date.  She had a mammogram in August of this year and that is up-to-date.  She is due for a flu shot but she would like to get this at work.  She is due for the shingles vaccine.  She is also due for COVID booster.  Past Medical History:  Diagnosis Date   Asthma    Asthma    Bronchitis    Edema,  lower extremity    High cholesterol    Obese    No past surgical history on file.  Current Outpatient Medications on File Prior to Visit  Medication Sig Dispense Refill   albuterol (VENTOLIN HFA) 108 (90 Base) MCG/ACT inhaler Inhale 2 puffs into the lungs every 6 (six) hours as needed for wheezing or shortness of breath. 3 each 3   cetirizine (ZYRTEC) 10 MG tablet Take 1 tablet (10 mg total) by mouth daily. 30 tablet 11   fluticasone (FLONASE) 50 MCG/ACT nasal spray Place 1 spray into both nostrils daily. 16 g 3   metFORMIN (GLUCOPHAGE) 500 MG tablet Take 1 tablet (500 mg total) by mouth daily. 30 tablet 0   Multiple Vitamin (MULTIVITAMIN) tablet Take 1 tablet by mouth daily.     phentermine 15 MG capsule Take 1 capsule (15 mg total) by mouth 2 (two) times daily. Will take in the evening and at night when she is working. 60 capsule 0   Vitamin D, Ergocalciferol, (DRISDOL) 1.25 MG (50000 UNIT) CAPS capsule Take 1 capsule (50,000 Units total) by mouth every 7 (seven) days. 4 capsule 0   No current facility-administered medications on file prior to visit.   No Known Allergies Social History  Socioeconomic History   Marital status: Married    Spouse name: Tyrone   Number of children: Not on file   Years of education: Not on file   Highest education level: Not on file  Occupational History   Occupation: LPN  Tobacco Use   Smoking status: Never   Smokeless tobacco: Never  Substance and Sexual Activity   Alcohol use: Yes    Comment: socially   Drug use: No   Sexual activity: Yes  Other Topics Concern   Not on file  Social History Narrative   He is married. She has 3 children. They have all completed high school her now out of the house.(as of 2015.)   She is retired from the [employer]. She works part-time at [employer]. She is in school with [employer] for nursing program.   Social Determinants of Health   Financial Resource Strain: Not on file  Food Insecurity: Not on  file  Transportation Needs: Not on file  Physical Activity: Not on file  Stress: Not on file  Social Connections: Not on file  Intimate Partner Violence: Not on file   Family History  Problem Relation Age of Onset   Cancer Mother 52       Colon Cancer   Alcohol abuse Father     Review of Systems     Objective:   Physical Exam Constitutional:      General: She is not in acute distress.    Appearance: Normal appearance. She is obese. She is not ill-appearing, toxic-appearing or diaphoretic.  HENT:     Head: Normocephalic and atraumatic.     Right Ear: Tympanic membrane, ear canal and external ear normal. There is no impacted cerumen.     Left Ear: Tympanic membrane, ear canal and external ear normal. There is no impacted cerumen.     Nose: Nose normal. No congestion or rhinorrhea.     Mouth/Throat:     Mouth: Mucous membranes are moist.     Pharynx: Oropharynx is clear. No oropharyngeal exudate or posterior oropharyngeal erythema.  Eyes:     General: No scleral icterus.       Right eye: No discharge.        Left eye: No discharge.     Extraocular Movements: Extraocular movements intact.     Conjunctiva/sclera: Conjunctivae normal.     Pupils: Pupils are equal, round, and reactive to light.  Neck:     Vascular: No carotid bruit.  Cardiovascular:     Rate and Rhythm: Normal rate and regular rhythm.     Pulses: Normal pulses.     Heart sounds: Normal heart sounds. No murmur heard.    No friction rub. No gallop.  Pulmonary:     Effort: Pulmonary effort is normal. No respiratory distress.     Breath sounds: Normal breath sounds. No stridor. No wheezing, rhonchi or rales.  Abdominal:     General: Abdomen is flat. Bowel sounds are normal. There is no distension.     Palpations: Abdomen is soft.     Tenderness: There is no abdominal tenderness. There is no guarding or rebound.  Musculoskeletal:        General: No swelling.     Cervical back: Normal range of motion and neck  supple. No rigidity or tenderness.     Right lower leg: No edema.     Left lower leg: No edema.  Lymphadenopathy:     Cervical: No cervical adenopathy.  Skin:  General: Skin is warm.     Coloration: Skin is not jaundiced.     Findings: No bruising, erythema, lesion or rash.  Neurological:     General: No focal deficit present.     Mental Status: She is alert and oriented to person, place, and time. Mental status is at baseline.     Cranial Nerves: No cranial nerve deficit.     Sensory: No sensory deficit.     Motor: No weakness.     Gait: Gait normal.     Deep Tendon Reflexes: Reflexes normal.         Assessment & Plan:  Pre-diabetes  High cholesterol  Colon cancer screening - Plan: Ambulatory referral to Gastroenterology  Visceral obesity  General medical exam Physical exam today is completely normal.  I did wait.  Had checked was outstanding.  I recommended a flu shot and COVID booster as well as a shingles booster.  Also will schedule patient for colonoscopy.  Mammogram and Pap smear are up-to-date.  Regular anticipatory guidance is provided.  Blood pressure is excellent.

## 2021-10-29 ENCOUNTER — Other Ambulatory Visit: Payer: Self-pay | Admitting: Family Medicine

## 2021-10-29 ENCOUNTER — Other Ambulatory Visit: Payer: Self-pay

## 2021-10-29 DIAGNOSIS — J4540 Moderate persistent asthma, uncomplicated: Secondary | ICD-10-CM

## 2021-10-29 DIAGNOSIS — J45901 Unspecified asthma with (acute) exacerbation: Secondary | ICD-10-CM

## 2021-10-29 DIAGNOSIS — R0602 Shortness of breath: Secondary | ICD-10-CM

## 2021-10-29 DIAGNOSIS — J069 Acute upper respiratory infection, unspecified: Secondary | ICD-10-CM

## 2021-10-29 MED ORDER — ALBUTEROL SULFATE HFA 108 (90 BASE) MCG/ACT IN AERS: 2.0000 | INHALATION_SPRAY | Freq: Four times a day (QID) | RESPIRATORY_TRACT | 3 refills | Status: DC | PRN

## 2021-10-29 NOTE — Telephone Encounter (Signed)
During recent OV, patient forgot to request refills for the following meds:  albuterol (VENTOLIN HFA) 108 (90 Base) MCG/ACT inhaler [[id number]]   cetirizine (ZYRTEC) 10 MG tablet [[id number]]   Pharmacy confirmed as:  [id number] G[place] Otto, [place] - [address] AT [facility] [address] [facility] [address]  [address], [place] [address]  Phone:  [phone]  Fax:  [phone]  DEA #:  [id number]  LOV: 10/25/2021  Please advise at [phone]

## 2021-10-30 MED ORDER — CETIRIZINE HCL 10 MG PO TABS: 10.0000 mg | ORAL_TABLET | Freq: Every day | ORAL | 0 refills | Status: DC

## 2021-10-30 NOTE — Telephone Encounter (Signed)
Requested Prescriptions  Pending Prescriptions Disp Refills  . cetirizine (ZYRTEC) 10 MG tablet 90 tablet 0    Sig: Take 1 tablet (10 mg total) by mouth daily.     Ear, Nose, and Throat:  Antihistamines 2 Failed - 10/29/2021  3:32 PM      Failed - Valid encounter within last 12 months    Recent Outpatient Visits          1 year ago Annual physical exam   [facility] Valentino Nose, NP   1 year ago Exacerbation of asthma, unspecified asthma severity, unspecified whether persistent   [facility] Valentino Nose, NP   2 years ago Upper respiratory tract infection, unspecified type   [facility] Elmore Guise, FNP   4 years ago Morbid obesity (HCC)   [facility] [place], Velna Hatchet, MD   4 years ago Morbid obesity [facility])   [facility] [place], Velna Hatchet, MD             Passed - Cr in normal range and within 360 days    Creat  Date Value Ref Range Status  08/13/2020 0.92 0.50 - 1.05 mg/dL Final    Comment:    For patients >26 years of age, the reference limit for Creatinine is approximately 13% higher for people identified as African-American. .    Creatinine, Ser  Date Value Ref Range Status  10/07/2021 0.93 0.57 - 1.00 mg/dL Final         '

## 2021-11-04 ENCOUNTER — Ambulatory Visit (INDEPENDENT_AMBULATORY_CARE_PROVIDER_SITE_OTHER): Payer: Commercial Managed Care - PPO | Admitting: Family Medicine

## 2021-11-04 ENCOUNTER — Encounter (INDEPENDENT_AMBULATORY_CARE_PROVIDER_SITE_OTHER): Payer: Self-pay | Admitting: Family Medicine

## 2021-11-04 VITALS — BP 132/78 | HR 67 | Temp 97.8°F | Ht 63.0 in

## 2021-11-04 DIAGNOSIS — E7849 Other hyperlipidemia: Secondary | ICD-10-CM | POA: Diagnosis not present

## 2021-11-04 DIAGNOSIS — E669 Obesity, unspecified: Secondary | ICD-10-CM | POA: Diagnosis not present

## 2021-11-04 DIAGNOSIS — E559 Vitamin D deficiency, unspecified: Secondary | ICD-10-CM

## 2021-11-04 DIAGNOSIS — R7303 Prediabetes: Secondary | ICD-10-CM | POA: Diagnosis not present

## 2021-11-04 DIAGNOSIS — Z6841 Body Mass Index (BMI) 40.0 and over, adult: Secondary | ICD-10-CM

## 2021-11-04 MED ORDER — VITAMIN D (ERGOCALCIFEROL) 1.25 MG (50000 UNIT) PO CAPS: 50000.0000 [IU] | ORAL_CAPSULE | ORAL | 0 refills | Status: DC

## 2021-11-04 MED ORDER — PHENTERMINE HCL 15 MG PO CAPS: ORAL_CAPSULE | ORAL | 0 refills | Status: DC

## 2021-11-04 MED ORDER — METFORMIN HCL 500 MG PO TABS: 500.0000 mg | ORAL_TABLET | Freq: Every day | ORAL | 0 refills | Status: DC

## 2021-11-07 NOTE — Progress Notes (Signed)
Chief Complaint:   OBESITY Laura Ryan is here to discuss her progress with her obesity treatment plan along with follow-up of her obesity related diagnoses. Laura Ryan is on the Category 2 Plan and states she is following her eating plan approximately 50% of the time. Laura Ryan states she is using her treadmill and walking 30 minutes 3-4 times per week.  Today's visit was #: 7 Starting weight: 247 lbs Starting date: 05/01/2021 Today's weight: 248 lbs Today's date: 11/04/2021 Total lbs lost to date: 0 Total lbs lost since last in-office visit: +3 lbs  Interim History: Laura Ryan has bee n eating out more and social gatherings, has gotten off track.  She is on Phentermine 15 mg BID since July.  Her weight has been around 240 lbs for about 8-9 years.  She used to exercise a lot in the [employer].  She is doing both cardio and weights at [employer] 3 times per week.  She denies meal skipping.  She has been taking phentermine 1st dose with all other medications and 1st meal of the day at 3:30 AM.  Subjective:   1. Vitamin D deficiency Discussed labs with patient today. Vitamin D level has improved to 48 from 19.4.  2. Pre-diabetes Discussed labs with patient today. Laura Ryan is on metformin 500 mg daily with food, she has occasional diarrhea.  A1c is 6.2.    3. Other hyperlipidemia Discussed labs with patient today. LDL 133, total cholesterol 206. Started Omega 3 fish oil supplement.   Assessment/Plan:   1. Vitamin D deficiency Refill - Vitamin D, Ergocalciferol, (DRISDOL) 1.25 MG (50000 UNIT) CAPS capsule; Take 1 capsule (50,000 Units total) by mouth every 7 (seven) days.  Dispense: 4 capsule; Refill: 0  2. Pre-diabetes Refill - metFORMIN (GLUCOPHAGE) 500 MG tablet; Take 1 tablet (500 mg total) by mouth daily.  Dispense: 30 tablet; Refill: 0  3. Other hyperlipidemia Continue heart healthy diet.   4. Obesity, current BMI 45.4 1) Change phentermine dose. Explained need for >2 lb of weight  loss in 3-4 weeks.   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   Change - phentermine 15 MG capsule; Take 1 capsule po 30 min before first meal of the day  Dispense: 30 capsule; Refill: 0  Laura Ryan is currently in the action stage of change. As such, her goal is to continue with weight loss efforts. She has agreed to the Category 2 Plan.   Exercise goals:  As is.   Behavioral modification strategies: increasing lean protein intake, increasing water intake, decreasing eating out, no skipping meals, keeping healthy foods in the home, ways to avoid boredom eating, better snacking choices, and decreasing junk food.  Laura Ryan has agreed to follow-up with our clinic in 3 weeks. She was informed of the importance of frequent follow-up visits to maximize her success with intensive lifestyle modifications for her multiple health conditions.   Objective:   Blood pressure 132/78, pulse 67, temperature 97.8 F (36.6 C), height 5\' 3"  (1.6 m), SpO2 98 %. Body mass index is 44.11 kg/m.  General: Cooperative, alert, well developed, in no acute distress. HEENT: Conjunctivae and lids unremarkable. Cardiovascular: Regular rhythm.  Lungs: Normal work of breathing. Neurologic: No focal deficits.   Lab Results  Component Value Date   CREATININE 0.93 10/07/2021   BUN 16 10/07/2021   NA 141 10/07/2021   K 4.5 10/07/2021   CL 101 10/07/2021   CO2 24 10/07/2021   Lab Results  Component Value Date   ALT  25 10/07/2021   AST 22 10/07/2021   ALKPHOS 84 10/07/2021   BILITOT 0.4 10/07/2021   Lab Results  Component Value Date   HGBA1C 6.2 (H) 10/07/2021   HGBA1C 6.3 (H) 05/01/2021   HGBA1C 5.9 (H) 08/13/2020   Lab Results  Component Value Date   INSULIN 14.2 10/07/2021   INSULIN 6.8 05/01/2021   Lab Results  Component Value Date   TSH 3.100 05/01/2021   Lab Results  Component Value Date   CHOL 206 (H) 10/07/2021   HDL 63 10/07/2021   LDLCALC 133 (H) 10/07/2021   TRIG 57 10/07/2021    CHOLHDL 2.8 08/13/2020   Lab Results  Component Value Date   VD25OH 48.0 10/07/2021   VD25OH 19.4 (L) 05/01/2021   VD25OH 23 (L) 08/13/2020   Lab Results  Component Value Date   WBC 11.6 (H) 03/27/2021   HGB 12.6 03/27/2021   HCT 40.2 03/27/2021   MCV 91.6 03/27/2021   PLT 170 03/27/2021   No results found for: "IRON", "TIBC", "FERRITIN"  Attestation Statements:   Reviewed by clinician on day of visit: allergies, medications, problem list, medical history, surgical history, family history, social history, and previous encounter notes.  I, Davy Pique, am acting as Location manager for Loyal Gambler, DO.   I have reviewed the above documentation for accuracy and completeness, and I agree with the above. Dell Ponto, DO

## 2021-11-18 LAB — HM COLONOSCOPY

## 2021-11-19 ENCOUNTER — Encounter (INDEPENDENT_AMBULATORY_CARE_PROVIDER_SITE_OTHER): Payer: Self-pay

## 2021-11-19 ENCOUNTER — Telehealth (INDEPENDENT_AMBULATORY_CARE_PROVIDER_SITE_OTHER): Payer: Self-pay | Admitting: Family Medicine

## 2021-11-19 NOTE — Telephone Encounter (Signed)
Dr. Valetta Close - Prior authorization approved for  Phentermine. Effective: 10/19/2021- 11/18/2022. Patient sent approval message via mychart.

## 2021-12-05 ENCOUNTER — Ambulatory Visit (INDEPENDENT_AMBULATORY_CARE_PROVIDER_SITE_OTHER): Payer: Commercial Managed Care - PPO | Admitting: Family Medicine

## 2021-12-25 ENCOUNTER — Ambulatory Visit (INDEPENDENT_AMBULATORY_CARE_PROVIDER_SITE_OTHER): Payer: Commercial Managed Care - PPO | Admitting: Family Medicine

## 2022-01-20 ENCOUNTER — Ambulatory Visit: Payer: Commercial Managed Care - PPO | Admitting: Family Medicine

## 2022-01-21 ENCOUNTER — Encounter: Payer: Self-pay | Admitting: Family Medicine

## 2022-01-21 ENCOUNTER — Ambulatory Visit (INDEPENDENT_AMBULATORY_CARE_PROVIDER_SITE_OTHER): Payer: Commercial Managed Care - PPO | Admitting: Family Medicine

## 2022-01-21 VITALS — BP 130/72 | HR 70 | Temp 97.6°F | Ht 63.0 in | Wt 256.0 lb

## 2022-01-21 DIAGNOSIS — Z6841 Body Mass Index (BMI) 40.0 and over, adult: Secondary | ICD-10-CM | POA: Diagnosis not present

## 2022-01-21 DIAGNOSIS — Z7689 Persons encountering health services in other specified circumstances: Secondary | ICD-10-CM

## 2022-01-21 DIAGNOSIS — E559 Vitamin D deficiency, unspecified: Secondary | ICD-10-CM | POA: Diagnosis not present

## 2022-01-21 DIAGNOSIS — R7303 Prediabetes: Secondary | ICD-10-CM | POA: Diagnosis not present

## 2022-01-21 MED ORDER — SAXENDA 18 MG/3ML ~~LOC~~ SOPN: PEN_INJECTOR | SUBCUTANEOUS | 0 refills | Status: DC

## 2022-01-21 NOTE — Progress Notes (Signed)
New Patient Office Visit  Subjective    Patient ID: Laura Ryan, female    DOB: 04/03/1965  Age: 56 y.o. MRN: [id number]  CC:  Chief Complaint  Patient presents with   Establish Care    HPI Laura Ryan presents to establish care. Oriented to practice routines and expectations. She had a physical with labs here in September and her only outstanding items were a Shingles vaccine. She has been attending healthy weight and wellness and would like to try [facility] for weight loss. She is currently on Phentermine. No other concerns today.   Outpatient Encounter Medications as of 01/21/2022  Medication Sig   albuterol (VENTOLIN HFA) 108 (90 Base) MCG/ACT inhaler Inhale 2 puffs into the lungs every 6 (six) hours as needed for wheezing or shortness of breath.   cetirizine (ZYRTEC) 10 MG tablet Take 1 tablet (10 mg total) by mouth daily.   fluticasone (FLONASE) 50 MCG/ACT nasal spray Place 1 spray into both nostrils daily.   Liraglutide -Weight Management (SAXENDA) 18 MG/3ML SOPN Inject 0.6 mg into the skin daily for 7 days, THEN 1.2 mg daily for 7 days, THEN 1.8 mg daily for 7 days, THEN 2.4 mg daily for 7 days, THEN 3 mg daily.   metFORMIN (GLUCOPHAGE) 500 MG tablet Take 1 tablet (500 mg total) by mouth daily.   Multiple Vitamin (MULTIVITAMIN) tablet Take 1 tablet by mouth daily.   Vitamin D, Ergocalciferol, (DRISDOL) 1.25 MG (50000 UNIT) CAPS capsule Take 1 capsule (50,000 Units total) by mouth every 7 (seven) days.   [DISCONTINUED] phentermine 15 MG capsule Take 1 capsule po 30 min before first meal of the day   No facility-administered encounter medications on file as of 01/21/2022.    Past Medical History:  Diagnosis Date   Asthma    Asthma    Bronchitis    Edema, lower extremity    High cholesterol    Obese     No past surgical history on file.  Family History  Problem Relation Age of Onset   Cancer Mother 58       Colon Cancer   Alcohol abuse Father     Social History    Socioeconomic History   Marital status: Married    Spouse name: Laura Ryan   Number of children: Not on file   Years of education: Not on file   Highest education level: Not on file  Occupational History   Occupation: LPN  Tobacco Use   Smoking status: Never   Smokeless tobacco: Never  Substance and Sexual Activity   Alcohol use: Yes    Comment: socially   Drug use: No   Sexual activity: Yes  Other Topics Concern   Not on file  Social History Narrative   He is married. She has 3 children. They have all completed high school her now out of the house.(as of 2015.)   She is retired from the [employer]. She works part-time at [employer]. She is in school with [employer] for nursing program.   Social Determinants of Health   Financial Resource Strain: Not on file  Food Insecurity: Not on file  Transportation Needs: Not on file  Physical Activity: Not on file  Stress: Not on file  Social Connections: Not on file  Intimate Partner Violence: Not on file    Review of Systems  All other systems reviewed and are negative.       Objective    BP 130/72   Pulse 70  Temp 97.6 F (36.4 C) (Oral)   Ht 5\' 3"  (1.6 m)   Wt 256 lb (116.1 kg)   SpO2 97%   BMI 45.35 kg/m   Physical Exam Vitals and nursing note reviewed.  Constitutional:      Appearance: Normal appearance. She is normal weight.  HENT:     Head: Normocephalic and atraumatic.  Cardiovascular:     Rate and Rhythm: Normal rate and regular rhythm.     Pulses: Normal pulses.     Heart sounds: Normal heart sounds.  Pulmonary:     Effort: Pulmonary effort is normal.     Breath sounds: Normal breath sounds.  Skin:    General: Skin is warm and dry.  Neurological:     General: No focal deficit present.     Mental Status: She is alert and oriented to person, place, and time. Mental status is at baseline.  Psychiatric:        Mood and Affect: Mood normal.        Behavior: Behavior normal.        Thought  Content: Thought content normal.        Judgment: Judgment normal.        Assessment & Plan:   Problem List Items Addressed This Visit       Other   Vitamin D deficiency   Relevant Orders   VITAMIN D 25 Hydroxy (Vit-D Deficiency, Fractures)   Morbid obesity (HCC)    Patient has been making efforts to lose weight for some time now and is interested in alternative pharmacological therapies. We discussed options and will start Saxenda at this time and discontinue Phentermine, she has been off of this for 2 weeks at this time. Discussed black box warning with patient and she denies history or family history of MTC or MEN2. Continue diet and exercise efforts and report intolerance.      Relevant Medications   Liraglutide -Weight Management (SAXENDA) 18 MG/3ML SOPN   Pre-diabetes   Relevant Orders   Hemoglobin A1c   Other Visit Diagnoses     Encounter to establish care with new doctor    -  Primary   Morbid obesity with BMI of 45.0-49.9, adult (HCC)       Relevant Medications   Liraglutide -Weight Management (SAXENDA) 18 MG/3ML SOPN       Return in about 2 months (around 03/24/2022) for follow-up.   05/23/2022, FNP

## 2022-01-21 NOTE — Assessment & Plan Note (Signed)
Patient has been making efforts to lose weight for some time now and is interested in alternative pharmacological therapies. We discussed options and will start Saxenda at this time and discontinue Phentermine, she has been off of this for 2 weeks at this time. Discussed black box warning with patient and she denies history or family history of MTC or MEN2. Continue diet and exercise efforts and report intolerance.

## 2022-01-22 ENCOUNTER — Other Ambulatory Visit: Payer: Self-pay | Admitting: Family Medicine

## 2022-01-22 DIAGNOSIS — E559 Vitamin D deficiency, unspecified: Secondary | ICD-10-CM

## 2022-01-22 LAB — HEMOGLOBIN A1C
Hgb A1c MFr Bld: 6.3 % of total Hgb — ABNORMAL HIGH (ref <5.7)
Mean Plasma Glucose: 134 mg/dL
eAG (mmol/L): 7.4 mmol/L

## 2022-01-22 LAB — VITAMIN D 25 HYDROXY (VIT D DEFICIENCY, FRACTURES): Vit D, 25-Hydroxy: 29 ng/mL — ABNORMAL LOW (ref 30–100)

## 2022-01-22 MED ORDER — VITAMIN D (ERGOCALCIFEROL) 1.25 MG (50000 UNIT) PO CAPS: 50000.0000 [IU] | ORAL_CAPSULE | ORAL | 0 refills | Status: AC

## 2022-02-07 ENCOUNTER — Emergency Department (HOSPITAL_COMMUNITY): Payer: Commercial Managed Care - PPO

## 2022-02-07 ENCOUNTER — Other Ambulatory Visit: Payer: Self-pay

## 2022-02-07 ENCOUNTER — Encounter (HOSPITAL_COMMUNITY): Payer: Self-pay

## 2022-02-07 ENCOUNTER — Emergency Department (HOSPITAL_COMMUNITY)
Admission: EM | Admit: 2022-02-07 | Discharge: 2022-02-07 | Disposition: A | Discharge status: Home / Self Care | Payer: Commercial Managed Care - PPO | Attending: Emergency Medicine | Admitting: Emergency Medicine

## 2022-02-07 DIAGNOSIS — Z7984 Long term (current) use of oral hypoglycemic drugs: Secondary | ICD-10-CM | POA: Insufficient documentation

## 2022-02-07 DIAGNOSIS — U071 COVID-19: Secondary | ICD-10-CM | POA: Insufficient documentation

## 2022-02-07 DIAGNOSIS — M79644 Pain in right finger(s): Secondary | ICD-10-CM | POA: Insufficient documentation

## 2022-02-07 DIAGNOSIS — J45909 Unspecified asthma, uncomplicated: Secondary | ICD-10-CM | POA: Diagnosis not present

## 2022-02-07 DIAGNOSIS — Z7951 Long term (current) use of inhaled steroids: Secondary | ICD-10-CM | POA: Insufficient documentation

## 2022-02-07 DIAGNOSIS — R059 Cough, unspecified: Secondary | ICD-10-CM | POA: Diagnosis present

## 2022-02-07 LAB — RESP PANEL BY RT-PCR (RSV, FLU A&B, COVID)  RVPGX2
Influenza A by PCR: NEGATIVE
Influenza B by PCR: NEGATIVE
Resp Syncytial Virus by PCR: NEGATIVE
SARS Coronavirus 2 by RT PCR: POSITIVE — AB

## 2022-02-07 MED ORDER — MOLNUPIRAVIR EUA 200MG CAPSULE: 4.0000 | ORAL_CAPSULE | Freq: Two times a day (BID) | ORAL | 0 refills | Status: AC

## 2022-02-07 MED ORDER — PREDNISONE 10 MG PO TABS: 40.0000 mg | ORAL_TABLET | Freq: Every day | ORAL | 0 refills | Status: DC

## 2022-02-07 NOTE — ED Provider Notes (Signed)
[facility]-EMERGENCY DEPT Provider Note   CSN: [id number] Arrival date & time: 02/07/22  0435     History  Chief Complaint  Patient presents with   Thumb Pain    Cough    Laura Ryan is a 56 y.o. female.   Cough   56 year old female presents emergency department with 2-day history of right thumb pain as well as 2-day history of cough.  Patient states that right thumb pain began insidiously upon awakening.  She notes pain with movement denies any weakness or sensory deficits in affected hand.  Denies history of gout, IV drug use, fever.  She also reports cough for the past 2 days as well as development of nasal congestion today.  Denies chest pain, shortness of breath, abdominal pain, nausea, vomiting, urinary symptoms, change in bowel habits.  Past medical history significant for asthma, obesity, hyperlipidemia, iron deficiency anemia  Home Medications Prior to Admission medications   Medication Sig Start Date End Date Taking? Authorizing Provider  molnupiravir EUA (LAGEVRIO) 200 mg CAPS capsule Take 4 capsules (800 mg total) by mouth 2 (two) times daily for 5 days. 02/07/22 02/12/22 Yes Sherian Maroon A, PA  predniSONE (DELTASONE) 10 MG tablet Take 4 tablets (40 mg total) by mouth daily. 02/07/22  Yes Sherian Maroon A, PA  albuterol (VENTOLIN HFA) 108 (90 Base) MCG/ACT inhaler Inhale 2 puffs into the lungs every 6 (six) hours as needed for wheezing or shortness of breath. 10/29/21   Donita Brooks, MD  cetirizine (ZYRTEC) 10 MG tablet Take 1 tablet (10 mg total) by mouth daily. 10/30/21   Donita Brooks, MD  fluticasone (FLONASE) 50 MCG/ACT nasal spray Place 1 spray into both nostrils daily. 07/05/20   Valentino Nose, NP  Liraglutide -Weight Management (SAXENDA) 18 MG/3ML SOPN Inject 0.6 mg into the skin daily for 7 days, THEN 1.2 mg daily for 7 days, THEN 1.8 mg daily for 7 days, THEN 2.4 mg daily for 7 days, THEN 3 mg daily. 01/21/22 02/18/23  Park Meo, FNP  metFORMIN (GLUCOPHAGE) 500 MG tablet Take 1 tablet (500 mg total) by mouth daily. 11/04/21   Bowen, Scot Jun, DO  Multiple Vitamin (MULTIVITAMIN) tablet Take 1 tablet by mouth daily.    [provider]  Vitamin D, Ergocalciferol, (DRISDOL) 1.25 MG (50000 UNIT) CAPS capsule Take 1 capsule (50,000 Units total) by mouth every 7 (seven) days. 01/22/22   Park Meo, FNP      Allergies    Patient has no known allergies.    Review of Systems   Review of Systems  Respiratory:  Positive for cough.   All other systems reviewed and are negative.   Physical Exam Updated Vital Signs BP (!) 153/91 (BP Location: Left Arm)   Pulse 87   Temp 98.3 F (36.8 C) (Oral)   Resp 18   Ht 5\' 3"  (1.6 m)   Wt 116.1 kg   SpO2 99%   BMI 45.35 kg/m  Physical Exam Vitals and nursing note reviewed.  Constitutional:      General: She is not in acute distress.    Appearance: She is well-developed.  HENT:     Head: Normocephalic and atraumatic.  Eyes:     Conjunctiva/sclera: Conjunctivae normal.  Cardiovascular:     Rate and Rhythm: Normal rate and regular rhythm.     Heart sounds: No murmur heard. Pulmonary:     Effort: Pulmonary effort is normal. No respiratory distress.  Comments: Wheeze/rhonchi auscultated bilateral lower lung fields Abdominal:     Palpations: Abdomen is soft.     Tenderness: There is no abdominal tenderness.  Musculoskeletal:        General: No swelling.     Cervical back: Neck supple.     Comments: Tender to palpation right thumb at MCP joint.  No overlying abnormalities appreciated.  Radial pulses full intact bilaterally.  Patient will make okay sign, resist horizontal adduction of digits, fist, thumbs up, extend at wrist without difficulty.  Strength symmetric bilaterally.  Patient complaining no sensory deficits along major nerve distributions of bilateral upper extremities.  No obvious joint laxity appreciated at MCP joint.  Skin:    General: Skin  is warm and dry.     Capillary Refill: Capillary refill takes less than 2 seconds.  Neurological:     Mental Status: She is alert.  Psychiatric:        Mood and Affect: Mood normal.     ED Results / Procedures / Treatments   Labs (all labs ordered are listed, but only abnormal results are displayed) Labs Reviewed  RESP PANEL BY RT-PCR (RSV, FLU A&B, COVID)  RVPGX2 - Abnormal; Notable for the following components:      Result Value   SARS Coronavirus 2 by RT PCR POSITIVE (*)    All other components within normal limits    EKG None  Radiology DG Finger Thumb Right  Result Date: 02/07/2022 CLINICAL DATA:  Acute onset of thumb pain 1 day ago. No known injury. EXAM: RIGHT THUMB 2+V COMPARISON:  None Available. FINDINGS: There is no evidence of fracture or dislocation. There is no evidence of arthropathy or other focal bone abnormality. Soft tissues are unremarkable. IMPRESSION: Negative. Electronically Signed   By: Danae Orleans M.D.   On: 02/07/2022 05:22    Procedures Procedures    Medications Ordered in ED Medications - No data to display  ED Course/ Medical Decision Making/ A&P                           Medical Decision Making Amount and/or Complexity of Data Reviewed Radiology: ordered.  Risk Prescription drug management.   This patient presents to the ED for concern of thumb pain/cough, this involves an extensive number of treatment options, and is a complaint that carries with it a high risk of complications and morbidity.  The differential diagnosis includes fracture, strain/sprain, dislocation, osteomyelitis, septic arthritis, rheumatoid arthritis, gout, pneumonia, COVID, RSV, flu, sepsis, meningitis   Co morbidities that complicate the patient evaluation  See HPI   Additional history obtained:  Additional history obtained from EMR External records from outside source obtained and reviewed including hospital records   Lab Tests:  I Ordered, and  personally interpreted labs.  The pertinent results include: Respiratory viral panel positive for coronavirus   Imaging Studies ordered:  I ordered imaging studies including DG right thumb I independently visualized and interpreted imaging which showed no acute osseous abnormality I agree with the radiologist interpretation   Cardiac Monitoring: / EKG:  The patient was maintained on a cardiac monitor.  I personally viewed and interpreted the cardiac monitored which showed an underlying rhythm of: Sinus rhythm   Consultations Obtained:  N/a   Problem List / ED Course / Critical interventions / Medication management  COVID/right thumb pain/asthma exacerbation Reevaluation of the patient that the patient stayed the same I have reviewed the patients home medicines and have  made adjustments as needed   Social Determinants of Health:  Denies tobacco, illicit drug use.   Test / Admission - Considered:  COVID/right thumb pain/asthma exacerbation Vitals signs significant for hypertension with blood pressure 153/91.  Recommend follow-up with primary care regarding elevation blood pressure.. Otherwise within normal range and stable throughout visit. Laboratory/imaging studies significant for: See above Patient's right thumb with no evidence of septic arthritis, fracture, dislocation, osteomyelitis.  Seems more likely sprain/strain.  Patient placed in right thumb/wrist brace and recommended symptomatic therapy at home with rest, ice, elevation, NSAID therapy.  Patient's cough most likely secondary to coronavirus without clinical evidence of pneumonia.  Will be given antiviral in the form of molnupiravir.  Patient also showed clinical signs of acute asthma exacerbation of which prednisone was given and patient recommended to continue therapy at home with albuterol inhaler.  Patient recommended to follow-up with primary care in 5 to 7 days.  Treatment plan discussed at length with patient and  she acknowledged understanding was agreeable to said plan. Worrisome signs and symptoms were discussed with the patient, and the patient acknowledged understanding to return to the ED if noticed. Patient was stable upon discharge.          Final Clinical Impression(s) / ED Diagnoses Final diagnoses:  COVID  Thumb pain, right  Asthma, unspecified asthma severity, unspecified whether complicated, unspecified whether persistent    Rx / DC Orders ED Discharge Orders          Ordered    predniSONE (DELTASONE) 10 MG tablet  Daily        02/07/22 0702    molnupiravir EUA (LAGEVRIO) 200 mg CAPS capsule  2 times daily        02/07/22 [id number]              Wilnette Kales, PA 02/07/22 [id number]    Fredia Sorrow, MD 02/10/22 [phone]

## 2022-02-07 NOTE — Discharge Instructions (Addendum)
Note the workup today was overall consistent with COVID as well as right thumb sprain.  Recommend using brace as needed for pain.  Take Tylenol/Motrin as needed for baseline pain.  You may rest, ice, elevate affected thumb to help with swelling as well as inflammation.  Also showed signs of very mild asthma exacerbation.  Will provide steroids for both thumb pain as well as asthma exacerbation.  Make sure to use albuterol inhaler at home as we discussed.  Also provided antiviral for COVID called molnupiravir.  Recommend reevaluation with primary care in 5 to 7 days.  Please not hesitate to return to emergency department for worrisome signs and symptoms we discussed become apparent.

## 2022-02-07 NOTE — Progress Notes (Signed)
Orthopedic Tech Progress Note Patient Details:  Oluwaseun Bruyere Mar 01, 1965 [id number]  Ortho Devices Type of Ortho Device: Thumb velcro splint Ortho Device/Splint Location: Right wrist/thumb Ortho Device/Splint Interventions: Application   Post Interventions Patient Tolerated: Well  Genelle Bal Earnest Mcgillis 02/07/2022, 9:59 AM

## 2022-02-07 NOTE — ED Triage Notes (Signed)
Nontraumatic right thumb pain since yesterday morning.   Also sts cough, runny nose and congestion.

## 2022-02-25 ENCOUNTER — Telehealth: Payer: Self-pay

## 2022-02-25 NOTE — Telephone Encounter (Signed)
Pt LVM re PA for wt loss meds(Saxenda)?  Will check and call pt back.

## 2022-02-26 NOTE — Telephone Encounter (Addendum)
Spoke w/pharmacy re: Laura Ryan, it is on back order and pharmacy suggest [facility]) for wt loss.   Call and spoke w/pt and is aware of the back order. Will forward msg to pcp and to see if pcp will send Rx for the new wt loss med?  Pls advice

## 2022-02-27 ENCOUNTER — Other Ambulatory Visit: Payer: Self-pay | Admitting: Family Medicine

## 2022-03-07 ENCOUNTER — Encounter: Payer: Self-pay | Admitting: Family Medicine

## 2022-03-11 ENCOUNTER — Other Ambulatory Visit: Payer: Self-pay | Admitting: Family Medicine

## 2022-05-26 ENCOUNTER — Ambulatory Visit (INDEPENDENT_AMBULATORY_CARE_PROVIDER_SITE_OTHER): Payer: Commercial Managed Care - PPO | Admitting: Bariatrics

## 2022-09-11 IMAGING — DX DG CHEST 2V
2 series · 2 of 2 positions shown · non-contrast
Comparison: 08/03/2020

CLINICAL DATA: Shortness of breath, cough and congestion.

EXAM:
CHEST - 2 VIEW

[chest pa]
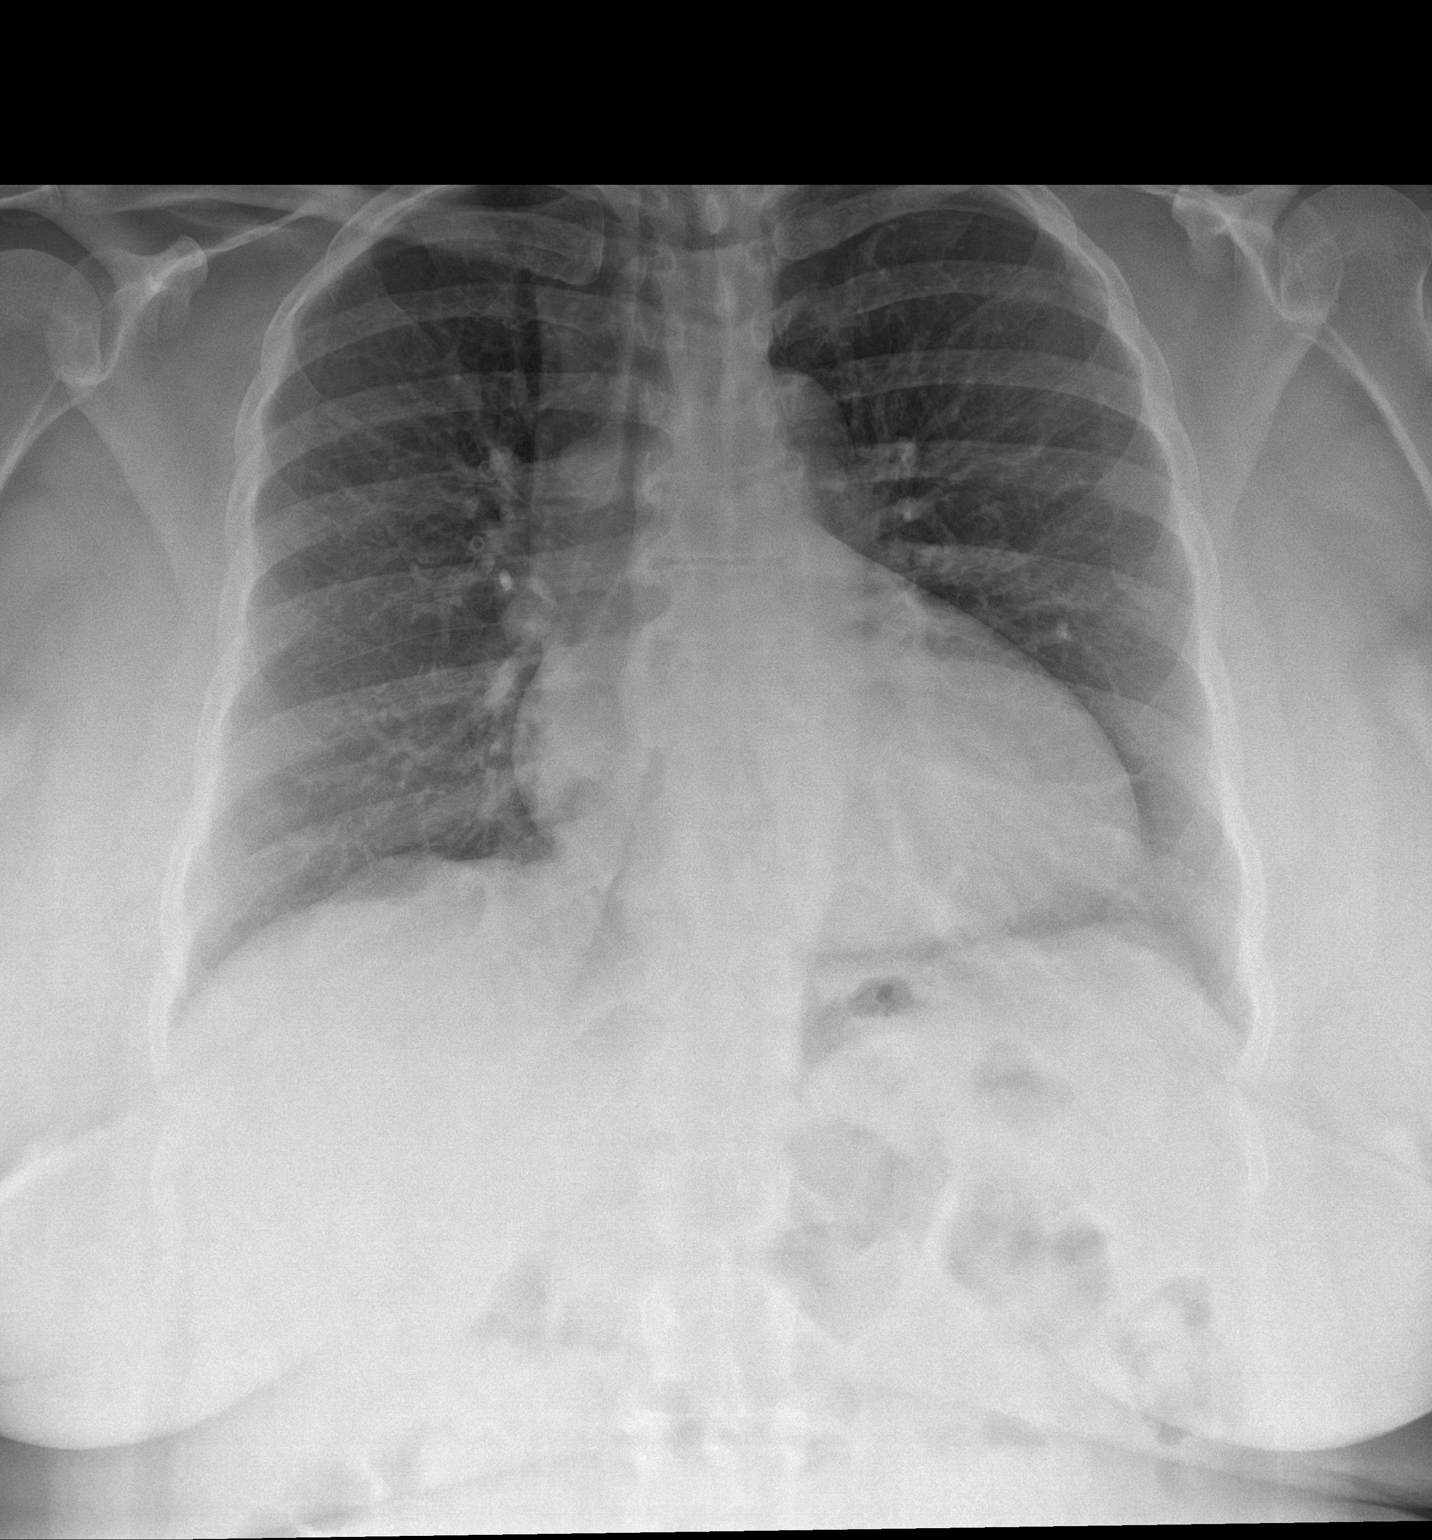

[chest lat]
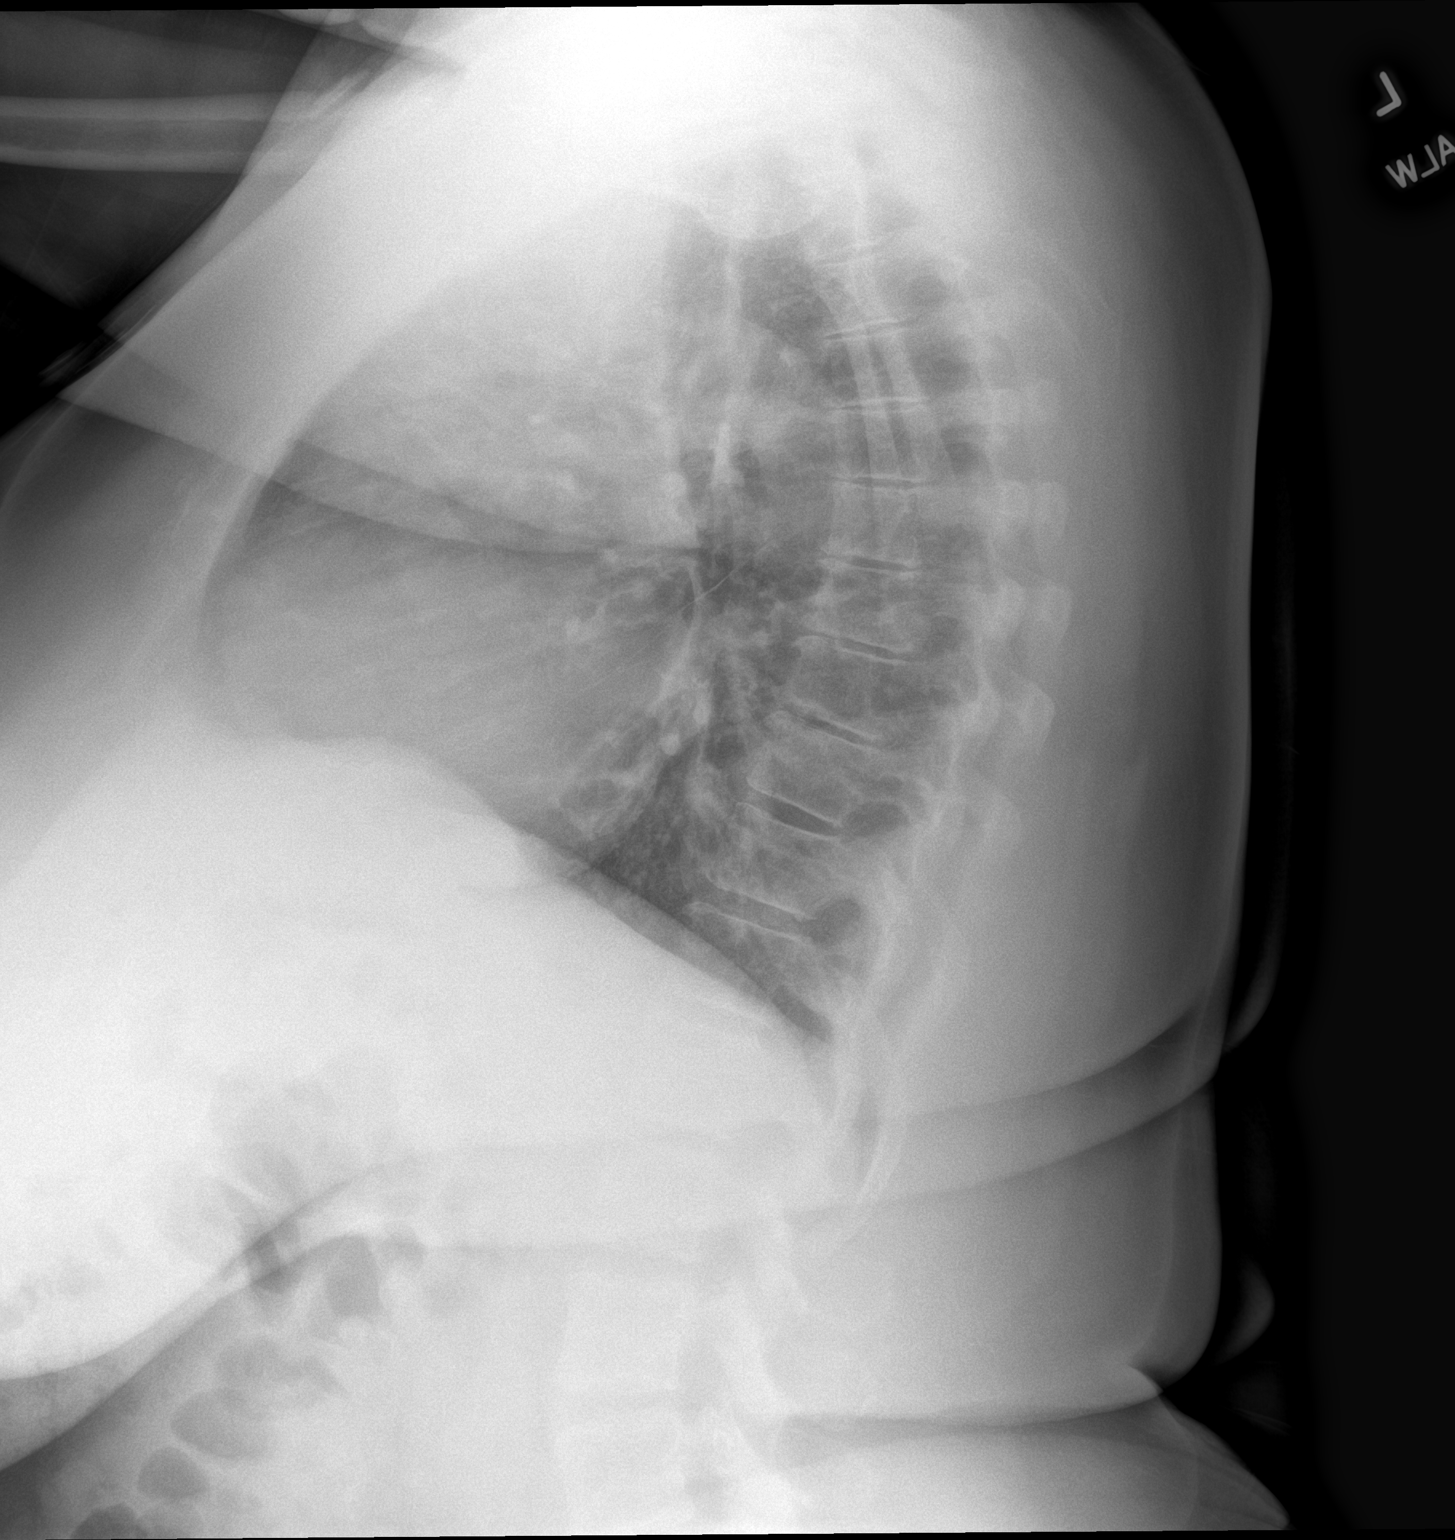

[2 of 2 positions shown; findings below may reference images not displayed]

FINDINGS: Cardiac silhouette is again minimally enlarged. Mediastinal contours
are within normal limits. The lungs are clear. No pleural effusion
or pneumothorax. Mild multilevel degenerative disc changes of the
midthoracic spine.
IMPRESSION: No active cardiopulmonary disease.

## 2022-10-22 ENCOUNTER — Other Ambulatory Visit: Payer: Commercial Managed Care - PPO

## 2022-10-22 DIAGNOSIS — R7303 Prediabetes: Secondary | ICD-10-CM

## 2022-10-22 DIAGNOSIS — E7800 Pure hypercholesterolemia, unspecified: Secondary | ICD-10-CM

## 2022-10-22 DIAGNOSIS — Z6841 Body Mass Index (BMI) 40.0 and over, adult: Secondary | ICD-10-CM

## 2022-10-22 DIAGNOSIS — D509 Iron deficiency anemia, unspecified: Secondary | ICD-10-CM

## 2022-10-22 DIAGNOSIS — E559 Vitamin D deficiency, unspecified: Secondary | ICD-10-CM

## 2022-10-27 ENCOUNTER — Ambulatory Visit (INDEPENDENT_AMBULATORY_CARE_PROVIDER_SITE_OTHER): Payer: Commercial Managed Care - PPO | Admitting: Family Medicine

## 2022-10-27 ENCOUNTER — Encounter: Payer: Self-pay | Admitting: Family Medicine

## 2022-10-27 VITALS — BP 150/100 | HR 64 | Temp 97.7°F | Ht 63.0 in | Wt 228.0 lb

## 2022-10-27 DIAGNOSIS — Z23 Encounter for immunization: Secondary | ICD-10-CM

## 2022-10-27 DIAGNOSIS — R030 Elevated blood-pressure reading, without diagnosis of hypertension: Secondary | ICD-10-CM

## 2022-10-27 DIAGNOSIS — Z6841 Body Mass Index (BMI) 40.0 and over, adult: Secondary | ICD-10-CM

## 2022-10-27 DIAGNOSIS — Z0001 Encounter for general adult medical examination with abnormal findings: Secondary | ICD-10-CM

## 2022-10-27 DIAGNOSIS — R7303 Prediabetes: Secondary | ICD-10-CM | POA: Diagnosis not present

## 2022-10-27 DIAGNOSIS — Z0000 Encounter for general adult medical examination without abnormal findings: Secondary | ICD-10-CM | POA: Insufficient documentation

## 2022-10-27 DIAGNOSIS — Z1159 Encounter for screening for other viral diseases: Secondary | ICD-10-CM

## 2022-10-27 DIAGNOSIS — E782 Mixed hyperlipidemia: Secondary | ICD-10-CM | POA: Insufficient documentation

## 2022-10-27 DIAGNOSIS — Z114 Encounter for screening for human immunodeficiency virus [HIV]: Secondary | ICD-10-CM

## 2022-10-27 MED ORDER — CETIRIZINE HCL 10 MG PO TABS: 10.0000 mg | ORAL_TABLET | Freq: Every day | ORAL | 0 refills | Status: AC

## 2022-10-27 MED ORDER — METFORMIN HCL 500 MG PO TABS: 500.0000 mg | ORAL_TABLET | Freq: Every day | ORAL | 1 refills | Status: DC

## 2022-10-27 NOTE — Assessment & Plan Note (Signed)
Restart Metformin 500mg  daily

## 2022-10-27 NOTE — Assessment & Plan Note (Signed)
Continue working toward your weight loss goals, I am proud of you for your hard work. Keep up the low calorie, heart healthy diet and 150 minutes of moderate intensity exercise weekly.Marland Kitchen

## 2022-10-27 NOTE — Addendum Note (Signed)
Addended by: Arta Silence on: 10/27/2022 12:19 PM   Modules accepted: Orders

## 2022-10-27 NOTE — Assessment & Plan Note (Signed)

## 2022-10-27 NOTE — Assessment & Plan Note (Signed)
Your labs showed elevated cholesterol. I recommend consuming a heart healthy diet such as Mediterranean diet or DASH diet with whole grains, fruits, vegetable, fish, lean meats, nuts, and olive oil. Limit sweets and processed foods. I also encourage moderate intensity exercise 150 minutes weekly. This is 3-5 times weekly for 30-50 minutes each session. Goal should be pace of 3 miles/hours, or walking 1.5 miles in 30 minutes. The 10-year ASCVD risk score (Arnett DK, et al., 2019) is: 5%

## 2022-10-27 NOTE — Assessment & Plan Note (Addendum)
BP elevated in office today 150/100. She denies history of elevated BP readings. Will monitor at home over the next week and return to my office with readings. Denies chest pain, palpitations, vision changes, recurrent headaches, swelling of extremities, dyspnea with exertion. Seek medical care for any of the above or if BP sustains >180/120.

## 2022-10-27 NOTE — Progress Notes (Signed)
Complete physical exam  Patient: Laura Ryan   DOB: 1966/02/17   57 y.o. Female  MRN: [id number]  Subjective:    Chief Complaint  Patient presents with   Annual Exam    Christinia Policastro is a 57 y.o. female who presents today for a complete physical exam. She reports consuming a  low carb diet  diet. Home exercise routine includes calisthenics. She generally feels well. She reports sleeping well. She does not have additional problems to discuss today. Flu and Shingles vaccines today.   The 10-year ASCVD risk score (Arnett DK, et al., 2019) is: 5%   Values used to calculate the score:     Age: 20 years     Sex: Female     Is Non-Hispanic African American: Yes     Diabetic: No     Tobacco smoker: No     Systolic Blood Pressure: 150 mmHg     Is BP treated: No     HDL Cholesterol: 68 mg/dL     Total Cholesterol: 204 mg/dL   Most recent fall risk assessment:    10/25/2021   10:33 AM  Fall Risk   Falls in the past year? 0  Number falls in past yr: 0  Injury with Fall? 0     Most recent depression screenings:    10/25/2021   10:32 AM 05/01/2021    7:53 AM  PHQ 2/9 Scores  PHQ - 2 Score 0 2  PHQ- 9 Score  6    Vision:Within last year and Dental: No current dental problems  Patient Active Problem List   Diagnosis Date Noted   Elevated blood pressure reading 10/27/2022   Physical exam, annual 10/27/2022   Moderate mixed hyperlipidemia not requiring statin therapy 10/27/2022   Other hyperlipidemia 11/04/2021   Pre-diabetes 10/07/2021   Elevated cholesterol 10/07/2021   Class 3 severe obesity with serious comorbidity and body mass index (BMI) of 45.0 to 49.9 in adult (HCC) 10/07/2021   Iron deficiency anemia 05/02/2021   BMI 40.0-44.9, adult (HCC) 08/13/2020   Asthma 06/27/2016   Morbid obesity (HCC) 06/27/2016   Vitamin D deficiency 03/07/2013   Past Medical History:  Diagnosis Date   Asthma    Asthma    Bronchitis    Edema, lower extremity    High cholesterol     Obese    No past surgical history on file. Social History   Tobacco Use   Smoking status: Never   Smokeless tobacco: Never  Substance Use Topics   Alcohol use: Yes    Comment: socially   Drug use: No   Family History  Problem Relation Age of Onset   Cancer Mother 96       Colon Cancer   Alcohol abuse Father    No Known Allergies    Patient Care Team: Park Meo, FNP as PCP - General (Family Medicine)   Outpatient Medications Prior to Visit  Medication Sig   albuterol (VENTOLIN HFA) 108 (90 Base) MCG/ACT inhaler Inhale 2 puffs into the lungs every 6 (six) hours as needed for wheezing or shortness of breath.   fluticasone (FLONASE) 50 MCG/ACT nasal spray Place 1 spray into both nostrils daily.   Multiple Vitamin (MULTIVITAMIN) tablet Take 1 tablet by mouth daily.   Vitamin D, Ergocalciferol, (DRISDOL) 1.25 MG (50000 UNIT) CAPS capsule Take 1 capsule (50,000 Units total) by mouth every 7 (seven) days.   [DISCONTINUED] cetirizine (ZYRTEC) 10 MG tablet Take 1 tablet (10 mg total) by  mouth daily.   [DISCONTINUED] metFORMIN (GLUCOPHAGE) 500 MG tablet Take 1 tablet (500 mg total) by mouth daily.   [DISCONTINUED] predniSONE (DELTASONE) 10 MG tablet Take 4 tablets (40 mg total) by mouth daily.   No facility-administered medications prior to visit.    Review of Systems  Constitutional: Negative.   HENT: Negative.    Eyes: Negative.   Respiratory: Negative.    Cardiovascular: Negative.   Gastrointestinal: Negative.   Genitourinary: Negative.   Musculoskeletal: Negative.   Skin: Negative.   Neurological: Negative.   Endo/Heme/Allergies: Negative.   Psychiatric/Behavioral: Negative.    All other systems reviewed and are negative.         Objective:     BP (!) 150/100   Pulse 64   Temp 97.7 F (36.5 C) (Oral)   Ht 5\' 3"  (1.6 m)   Wt 228 lb (103.4 kg)   SpO2 99%   BMI 40.39 kg/m  BP Readings from Last 3 Encounters:  10/27/22 (!) 150/100  02/07/22 (!)  153/91  01/21/22 130/72   Wt Readings from Last 3 Encounters:  10/27/22 228 lb (103.4 kg)  02/07/22 256 lb (116.1 kg)  01/21/22 256 lb (116.1 kg)      Physical Exam Vitals and nursing note reviewed.  Constitutional:      Appearance: Normal appearance. She is obese.  HENT:     Head: Normocephalic and atraumatic.     Right Ear: Tympanic membrane, ear canal and external ear normal.     Left Ear: Tympanic membrane, ear canal and external ear normal.     Nose: Nose normal.     Mouth/Throat:     Mouth: Mucous membranes are moist.     Pharynx: Oropharynx is clear.  Eyes:     Extraocular Movements: Extraocular movements intact.     Conjunctiva/sclera: Conjunctivae normal.     Pupils: Pupils are equal, round, and reactive to light.  Cardiovascular:     Rate and Rhythm: Normal rate and regular rhythm.     Pulses: Normal pulses.     Heart sounds: Normal heart sounds.  Pulmonary:     Effort: Pulmonary effort is normal.     Breath sounds: Normal breath sounds.  Abdominal:     General: Bowel sounds are normal.     Palpations: Abdomen is soft.  Musculoskeletal:        General: Normal range of motion.     Cervical back: Normal range of motion and neck supple.  Skin:    General: Skin is warm and dry.     Capillary Refill: Capillary refill takes less than 2 seconds.  Neurological:     General: No focal deficit present.     Mental Status: She is alert and oriented to person, place, and time. Mental status is at baseline.  Psychiatric:        Mood and Affect: Mood normal.        Behavior: Behavior normal.        Thought Content: Thought content normal.        Judgment: Judgment normal.      No results found for any visits on 10/27/22. Last CBC Lab Results  Component Value Date   WBC 7.4 10/22/2022   HGB 12.8 10/22/2022   HCT 39.3 10/22/2022   MCV 91.2 10/22/2022   MCH 29.7 10/22/2022   RDW 13.6 10/22/2022   PLT 194 10/22/2022   Last metabolic panel Lab Results   Component Value Date   GLUCOSE 94 10/22/2022  NA 139 10/22/2022   K 4.6 10/22/2022   CL 100 10/22/2022   CO2 28 10/22/2022   BUN 20 10/22/2022   CREATININE 1.15 (H) 10/22/2022   EGFR 56 (L) 10/22/2022   CALCIUM 9.8 10/22/2022   PROT 7.6 10/22/2022   ALBUMIN 4.6 10/07/2021   LABGLOB 2.7 10/07/2021   AGRATIO 1.7 10/07/2021   BILITOT 0.4 10/22/2022   ALKPHOS 84 10/07/2021   AST 20 10/22/2022   ALT 18 10/22/2022   ANIONGAP 8 03/27/2021   Last lipids Lab Results  Component Value Date   CHOL 204 (H) 10/22/2022   HDL 68 10/22/2022   LDLCALC 119 (H) 10/22/2022   TRIG 75 10/22/2022   CHOLHDL 3.0 10/22/2022   Last hemoglobin A1c Lab Results  Component Value Date   HGBA1C 6.2 (H) 10/22/2022   Last thyroid functions Lab Results  Component Value Date   TSH 3.100 05/01/2021   Last vitamin D Lab Results  Component Value Date   VD25OH 31 10/22/2022   Last vitamin B12 and Folate No results found for: "VITAMINB12", "FOLATE"      Assessment & Plan:    Routine Health Maintenance and Physical Exam  Immunization History  Administered Date(s) Administered   Hep A / Hep B 10/11/2002, 12/08/2002, 12/26/2003   Influenza,inj,Quad PF,6+ Mos 03/07/2013   Influenza,inj,quad, With Preservative 11/10/2018   MMR 11/18/1984, 08/22/2004   Meningococcal Conjugate 10/22/1984   Moderna SARS-COV2 Booster Vaccination 02/28/2019, 01/03/2020   Moderna Sars-Covid-2 Vaccination 04/17/2019   OPV 11/18/1984   PPD Test 03/07/2013, 03/15/2013   Pneumococcal Polysaccharide-23 09/22/2016   Smallpox 11/18/1984   Td 10/22/1984, 11/18/1984, 08/06/1994, 07/29/2004   Tdap 03/07/2013   Typhoid Inactivated 10/22/1984   Yellow Fever 11/18/1984    Health Maintenance  Topic Date Due   HIV Screening  Never done   Hepatitis C Screening  Never done   Zoster Vaccines- Shingrix (1 of 2) Never done   INFLUENZA VACCINE  09/18/2022   COVID-19 Vaccine (3 - 2023-24 season) 10/19/2022   DTaP/Tdap/Td (6 -  Td or Tdap) 03/08/2023   PAP SMEAR-Modifier  08/14/2023   MAMMOGRAM  10/02/2023   Colonoscopy  11/19/2026   HPV VACCINES  Aged Out    Discussed health benefits of physical activity, and encouraged her to engage in regular exercise appropriate for her age and condition.  Problem List Items Addressed This Visit     Pre-diabetes    Restart Metformin 500mg  daily.       Relevant Medications   metFORMIN (GLUCOPHAGE) 500 MG tablet   Class 3 severe obesity with serious comorbidity and body mass index (BMI) of 45.0 to 49.9 in adult [facility])    Continue working toward your weight loss goals, I am proud of you for your hard work. Keep up the low calorie, heart healthy diet and 150 minutes of moderate intensity exercise weekly..      Relevant Medications   metFORMIN (GLUCOPHAGE) 500 MG tablet   Elevated blood pressure reading    BP elevated in office today 150/100. She denies history of elevated BP readings. Will monitor at home over the next week and return to my office with readings. Denies chest pain, palpitations, vision changes, recurrent headaches, swelling of extremities, dyspnea with exertion. Seek medical care for any of the above or if BP sustains >180/120.      Physical exam, annual - Primary    Today your medical history was reviewed and routine physical exam with labs was performed. Recommend 150 minutes of moderate intensity  exercise weekly and consuming a well-balanced diet. Advised to stop smoking if a smoker, avoid smoking if a non-smoker, limit alcohol consumption to 1 drink per day for women and 2 drinks per day for men, and avoid illicit drug use. Counseled on safe sex practices and offered STI testing today. Counseled on the importance of sunscreen use. Counseled in mental health awareness and when to seek medical care. Vaccine maintenance discussed. Appropriate health maintenance items reviewed. Return to office in 1 year for annual physical exam.       Moderate mixed  hyperlipidemia not requiring statin therapy    Your labs showed elevated cholesterol. I recommend consuming a heart healthy diet such as Mediterranean diet or DASH diet with whole grains, fruits, vegetable, fish, lean meats, nuts, and olive oil. Limit sweets and processed foods. I also encourage moderate intensity exercise 150 minutes weekly. This is 3-5 times weekly for 30-50 minutes each session. Goal should be pace of 3 miles/hours, or walking 1.5 miles in 30 minutes. The 10-year ASCVD risk score (Arnett DK, et al., 2019) is: 5%       Other Visit Diagnoses     Screening for HIV (human immunodeficiency virus)       Relevant Orders   HIV Antibody (routine testing w rflx)   Need for hepatitis C screening test       Relevant Orders   Hepatitis C antibody      Return in about 1 week (around 11/03/2022) for hypertension.     Park Meo, FNP

## 2022-10-28 LAB — CBC WITH DIFFERENTIAL/PLATELET
Absolute Monocytes: 733 {cells}/uL (ref 200–950)
Basophils Absolute: 37 {cells}/uL (ref 0–200)
Basophils Relative: 0.5 %
Eosinophils Absolute: 266 {cells}/uL (ref 15–500)
Eosinophils Relative: 3.6 %
HCT: 39.3 % (ref 35.0–45.0)
Hemoglobin: 12.8 g/dL (ref 11.7–15.5)
Lymphs Abs: 2427 {cells}/uL (ref 850–3900)
MCH: 29.7 pg (ref 27.0–33.0)
MCHC: 32.6 g/dL (ref 32.0–36.0)
MCV: 91.2 fL (ref 80.0–100.0)
MPV: 13.6 fL — ABNORMAL HIGH (ref 7.5–12.5)
Monocytes Relative: 9.9 %
Neutro Abs: 3937 {cells}/uL (ref 1500–7800)
Neutrophils Relative %: 53.2 %
Platelets: 194 10*3/uL (ref 140–400)
RBC: 4.31 10*6/uL (ref 3.80–5.10)
RDW: 13.6 % (ref 11.0–15.0)
Total Lymphocyte: 32.8 %
WBC: 7.4 10*3/uL (ref 3.8–10.8)

## 2022-10-28 LAB — COMPLETE METABOLIC PANEL WITH GFR
AG Ratio: 1.6 (calc) (ref 1.0–2.5)
ALT: 18 U/L (ref 6–29)
AST: 20 U/L (ref 10–35)
Albumin: 4.7 g/dL (ref 3.6–5.1)
Alkaline phosphatase (APISO): 86 U/L (ref 37–153)
BUN/Creatinine Ratio: 17 (calc) (ref 6–22)
BUN: 20 mg/dL (ref 7–25)
CO2: 28 mmol/L (ref 20–32)
Calcium: 9.8 mg/dL (ref 8.6–10.4)
Chloride: 100 mmol/L (ref 98–110)
Creat: 1.15 mg/dL — ABNORMAL HIGH (ref 0.50–1.03)
Globulin: 2.9 g/dL (ref 1.9–3.7)
Glucose, Bld: 94 mg/dL (ref 65–99)
Potassium: 4.6 mmol/L (ref 3.5–5.3)
Sodium: 139 mmol/L (ref 135–146)
Total Bilirubin: 0.4 mg/dL (ref 0.2–1.2)
Total Protein: 7.6 g/dL (ref 6.1–8.1)
eGFR: 56 mL/min/{1.73_m2} — ABNORMAL LOW (ref >=60)

## 2022-10-28 LAB — LIPID PANEL
Cholesterol: 204 mg/dL — ABNORMAL HIGH (ref <200)
HDL: 68 mg/dL (ref >=50)
LDL Cholesterol (Calc): 119 mg/dL — ABNORMAL HIGH
Non-HDL Cholesterol (Calc): 136 mg/dL — ABNORMAL HIGH (ref <130)
Total CHOL/HDL Ratio: 3 (calc) (ref <5.0)
Triglycerides: 75 mg/dL (ref <150)

## 2022-10-28 LAB — HIV ANTIBODY (ROUTINE TESTING W REFLEX): HIV 1&2 Ab, 4th Generation: NONREACTIVE

## 2022-10-28 LAB — TEST AUTHORIZATION

## 2022-10-28 LAB — HEMOGLOBIN A1C
Hgb A1c MFr Bld: 6.2 %{Hb} — ABNORMAL HIGH (ref <5.7)
Mean Plasma Glucose: 131 mg/dL
eAG (mmol/L): 7.3 mmol/L

## 2022-10-28 LAB — HEPATITIS C ANTIBODY: Hepatitis C Ab: NONREACTIVE

## 2022-10-28 LAB — VITAMIN D 25 HYDROXY (VIT D DEFICIENCY, FRACTURES): Vit D, 25-Hydroxy: 31 ng/mL (ref 30–100)

## 2022-11-03 ENCOUNTER — Ambulatory Visit: Payer: Commercial Managed Care - PPO | Admitting: Family Medicine

## 2022-11-06 ENCOUNTER — Ambulatory Visit: Payer: Commercial Managed Care - PPO | Admitting: Family Medicine

## 2022-11-10 ENCOUNTER — Encounter: Payer: Self-pay | Admitting: Family Medicine

## 2022-11-10 ENCOUNTER — Ambulatory Visit: Payer: Commercial Managed Care - PPO | Admitting: Family Medicine

## 2022-11-10 VITALS — BP 130/82 | HR 58 | Temp 98.2°F | Ht 63.0 in | Wt 228.0 lb

## 2022-11-10 DIAGNOSIS — R030 Elevated blood-pressure reading, without diagnosis of hypertension: Secondary | ICD-10-CM | POA: Diagnosis not present

## 2022-11-10 NOTE — Progress Notes (Signed)
/-/  Subjective:  HPI: Laura Ryan is a 57 y.o. female presenting on 11/10/2022 for Follow-up and Hypertension   Hypertension   Patient is in today for blood pressure follow up. She has been monitoring at home and BP has ranged 106/83-151/79 averaging less than 140/90 at home. She denies chest pain, palpitations, recurrent headaches, vision changes, shortness of breath, swelling of extremities.  Home readings BID 9/9-9/22: 116/72, 134/85, 135/78, 126/81, 132/75, 129/67, 151/79, 143/83, 145/60, 150/76, 136/84, 106/83, 130/68, 142/74, 128/72, 130/76, 136/74, 130/76, 136/74, 128/72, 130/72   Review of Systems  All other systems reviewed and are negative.   Relevant past medical history reviewed and updated as indicated.   Past Medical History:  Diagnosis Date   Asthma    Asthma    Bronchitis    Edema, lower extremity    High cholesterol    Obese      No past surgical history on file.  Allergies and medications reviewed and updated.   Current Outpatient Medications:    albuterol (VENTOLIN HFA) 108 (90 Base) MCG/ACT inhaler, Inhale 2 puffs into the lungs every 6 (six) hours as needed for wheezing or shortness of breath., Disp: 3 each, Rfl: 3   cetirizine (ZYRTEC) 10 MG tablet, Take 1 tablet (10 mg total) by mouth daily., Disp: 90 tablet, Rfl: 0   cyclobenzaprine (FLEXERIL) 5 MG tablet, Take 5 mg by mouth., Disp: , Rfl:    fluticasone (FLONASE) 50 MCG/ACT nasal spray, Place 1 spray into both nostrils daily., Disp: 16 g, Rfl: 3   metFORMIN (GLUCOPHAGE) 500 MG tablet, Take 1 tablet (500 mg total) by mouth daily., Disp: 90 tablet, Rfl: 1   Multiple Vitamin (MULTIVITAMIN) tablet, Take 1 tablet by mouth daily., Disp: , Rfl:    Vitamin D, Ergocalciferol, (DRISDOL) 1.25 MG (50000 UNIT) CAPS capsule, Take 1 capsule (50,000 Units total) by mouth every 7 (seven) days., Disp: 4 capsule, Rfl: 0  No Known Allergies  Objective:   BP 130/82   Pulse (!) 58   Temp 98.2 F (36.8 C) (Oral)    Ht 5\' 3"  (1.6 m)   Wt 228 lb (103.4 kg)   LMP  (Approximate)   SpO2 100%   BMI 40.39 kg/m      11/10/2022   11:21 AM 10/27/2022    9:34 AM 02/07/2022    4:58 AM  Vitals with BMI  Height 5\' 3"  5\' 3"  5\' 3"   Weight 228 lbs 228 lbs 256 lbs  BMI 40.4 40.4 45.36  Systolic 130 150 621  Diastolic 82 100 91  Pulse 58 64 87     Physical Exam Vitals and nursing note reviewed.  Constitutional:      Appearance: Normal appearance. She is normal weight.  HENT:     Head: Normocephalic and atraumatic.  Cardiovascular:     Rate and Rhythm: Normal rate and regular rhythm.     Pulses: Normal pulses.     Heart sounds: Normal heart sounds.  Pulmonary:     Effort: Pulmonary effort is normal.     Breath sounds: Normal breath sounds.  Skin:    General: Skin is warm and dry.  Neurological:     General: No focal deficit present.     Mental Status: She is alert and oriented to person, place, and time. Mental status is at baseline.  Psychiatric:        Mood and Affect: Mood normal.        Behavior: Behavior normal.  Thought Content: Thought content normal.        Judgment: Judgment normal.     Assessment & Plan:  Elevated blood pressure reading Assessment & Plan: BP better in office 130/82 today and home readings average <140/90. Denies chest pain, palpitations, vision changes, recurrent headaches, swelling of extremities, dyspnea with exertion. Seek medical care for any of the above or if BP sustains >140/90. Follow up in 3 months. Return to office soon for recheck of CMP, will defer this today as she was just seen in ED yesterday and received a Toradol shot for arm pain, has no been well hydrated.  Orders: -     COMPLETE METABOLIC PANEL WITH GFR     Follow up plan: Return in about 3 months (around 02/09/2023) for chronic follow-up with labs 1 week prior.  Park Meo, FNP

## 2022-11-10 NOTE — Assessment & Plan Note (Signed)
BP better in office 130/82 today and home readings average <140/90. Denies chest pain, palpitations, vision changes, recurrent headaches, swelling of extremities, dyspnea with exertion. Seek medical care for any of the above or if BP sustains >140/90. Follow up in 3 months. Return to office soon for recheck of CMP, will defer this today as she was just seen in ED yesterday and received a Toradol shot for arm pain, has no been well hydrated.

## 2022-11-12 ENCOUNTER — Other Ambulatory Visit: Payer: Commercial Managed Care - PPO

## 2022-12-10 DIAGNOSIS — Z860100 Personal history of colon polyps, unspecified: Secondary | ICD-10-CM | POA: Insufficient documentation

## 2022-12-10 DIAGNOSIS — Z800 Family history of malignant neoplasm of digestive organs: Secondary | ICD-10-CM | POA: Insufficient documentation

## 2022-12-11 ENCOUNTER — Ambulatory Visit: Payer: Commercial Managed Care - PPO | Admitting: Family Medicine

## 2022-12-11 ENCOUNTER — Encounter: Payer: Self-pay | Admitting: Family Medicine

## 2022-12-11 VITALS — BP 122/84 | HR 76 | Temp 98.5°F | Ht 61.81 in | Wt 226.8 lb

## 2022-12-11 DIAGNOSIS — J302 Other seasonal allergic rhinitis: Secondary | ICD-10-CM

## 2022-12-11 DIAGNOSIS — J4540 Moderate persistent asthma, uncomplicated: Secondary | ICD-10-CM | POA: Diagnosis not present

## 2022-12-11 DIAGNOSIS — Z7689 Persons encountering health services in other specified circumstances: Secondary | ICD-10-CM

## 2022-12-11 DIAGNOSIS — E66813 Obesity, class 3: Secondary | ICD-10-CM | POA: Diagnosis not present

## 2022-12-11 DIAGNOSIS — Z23 Encounter for immunization: Secondary | ICD-10-CM

## 2022-12-11 DIAGNOSIS — Z6841 Body Mass Index (BMI) 40.0 and over, adult: Secondary | ICD-10-CM

## 2022-12-11 DIAGNOSIS — R7989 Other specified abnormal findings of blood chemistry: Secondary | ICD-10-CM | POA: Diagnosis not present

## 2022-12-11 NOTE — Progress Notes (Signed)
Established Patient Office Visit   Subjective  Patient ID: Laura Ryan, female    DOB: 1965/06/30  Age: 57 y.o. MRN: [id number]  Chief Complaint  Patient presents with   New Patient (Initial Visit)    Patient is a 57 year old female previously seen by Kurtis Bushman, FNP at [facility] family medicine who presents to establish care and follow-up on chronic conditions.  Patient mentions being told creatinine was elevated on labs checked in September.  Patient does not recall if she was well hydrated at the time.  Patient also notes brief elevation in BP and office visit, but normal during other visits and when checked for a week at home.  Patient has not required BP meds.  History of asthma: Typically has symptoms with viral URI/allergies.  Albuterol inhaler as needed.  Typically able to get on top with symptoms but occasionally requires ED visit and steroids.  Weight: Previously seen healthy weight clinic.  Started on metformin to help with weight and A1c.  States A1c at the time was 6.2%.  Patient endorses exercising 6 days/week.  Typically does some type of cardio.  Participates in an online fitness work and has a Occupational hygienist.  Increasing intake of water at least 80 ounces - 90 ounces per day.  In the past tried phentermine but interested in more natural options.  Patient also intermittent fasting, eating a healthy fat such as nuts, 3-4 ounces of protein 3 times a day, and decreasing carbs and sugar.  Patient was 268 lbs 1 year ago.  Now to 226.8 lbs. patient states she was in the 170s several years ago upon completing service with the [employer].  Patient works nights.  Seasonal allergies: OTC meds like Zyrtec and Flonase.  Allergies: NKDA  Past surgical history: C-section 1995  Social history: Patient is married.  She works nights as an Public house manager.  Patient has 3 children.  Patient may help with grandchildren during the day.  May drink wine socially.  Denies tobacco or drug  use.  Healthcare maintenance: Colonoscopy 2023.  Started getting them at age 67 due to family history. Mammogram: Pap 08/13/2020.  Endorses history of normal Paps in the past. Influenza vaccine 2024  Family medical history: Mom-deceased, diagnosed with colon cancer in mid 21s.  Cancer never went into remission. Dad-deceased, alcohol abuse, cirrhosis, early death at 18 Sister-Pamela, alive    Patient Active Problem List   Diagnosis Date Noted   History of colonic polyps 12/10/2022   Family history of malignant neoplasm of digestive organs 12/10/2022   Elevated blood pressure reading 10/27/2022   Physical exam, annual 10/27/2022   Moderate mixed hyperlipidemia not requiring statin therapy 10/27/2022   Other hyperlipidemia 11/04/2021   Pre-diabetes 10/07/2021   Elevated cholesterol 10/07/2021   Class 3 severe obesity with serious comorbidity and body mass index (BMI) of 45.0 to 49.9 in adult (HCC) 10/07/2021   Iron deficiency anemia 05/02/2021   BMI 40.0-44.9, adult (HCC) 08/13/2020   Asthma 06/27/2016   Morbid obesity (HCC) 06/27/2016   Vitamin D deficiency 03/07/2013   Past Medical History:  Diagnosis Date   Asthma    Asthma    Bronchitis    Edema, lower extremity    High cholesterol    Obese    History reviewed. No pertinent surgical history. Social History   Tobacco Use   Smoking status: Never   Smokeless tobacco: Never  Substance Use Topics   Alcohol use: Yes    Comment: socially   Drug  use: No   Family History  Problem Relation Age of Onset   Cancer Mother 54       Colon Cancer   Alcohol abuse Father    No Known Allergies    ROS Negative unless stated above    Objective:     BP 122/84 (BP Location: Left Arm, Patient Position: Sitting, Cuff Size: Normal)   Pulse 76   Temp 98.5 F (36.9 C) (Oral)   Ht 5' 1.81" (1.57 m)   Wt 226 lb 12.8 oz (102.9 kg)   LMP  (LMP Unknown)   SpO2 98%   BMI 41.74 kg/m  BP Readings from Last 3 Encounters:   12/11/22 122/84  11/10/22 130/82  10/27/22 (!) 150/100   Wt Readings from Last 3 Encounters:  12/11/22 226 lb 12.8 oz (102.9 kg)  11/10/22 228 lb (103.4 kg)  10/27/22 228 lb (103.4 kg)      Physical Exam Constitutional:      General: She is not in acute distress.    Appearance: Normal appearance.  HENT:     Head: Normocephalic and atraumatic.     Nose: Nose normal.     Mouth/Throat:     Mouth: Mucous membranes are moist.  Cardiovascular:     Rate and Rhythm: Normal rate and regular rhythm.     Heart sounds: Normal heart sounds. No murmur heard.    No gallop.  Pulmonary:     Effort: Pulmonary effort is normal. No respiratory distress.     Breath sounds: Normal breath sounds. No wheezing, rhonchi or rales.  Skin:    General: Skin is warm and dry.  Neurological:     Mental Status: She is alert and oriented to person, place, and time.     No results found for any visits on 12/11/22.    Assessment & Plan:  Moderate persistent asthma without complication -Stable -Continue albuterol inhaler as needed  Need for shingles vaccine -Shingles vaccine dose 1 given 10/27/2022 -Minimum interval between dose 1 and 2 is 4 weeks. -Dose 2 given this visit -     Varicella-zoster vaccine IM  Seasonal allergies -Continue OTC Zyrtec 10 mg and Flonase  Class 3 severe obesity with serious comorbidity and body mass index (BMI) of 40.0 to 44.9 in adult, unspecified obesity type (HCC) -Body mass index is 41.74 kg/m. -Congratulated on weight loss -Continue lifestyle modifications  Elevated serum creatinine -Creatinine 1.15 on 10/22/2022.   -Discussed hydration -recheck at patient convenience. -     Basic metabolic panel; Future  Encounter to establish care -We reviewed the PMH, PSH, FH, SH, Meds and Allergies. -We provided refills for any medications we will prescribe as needed. -We addressed current concerns per orders and patient instructions. -We have asked for records for  pertinent exams, studies, vaccines and notes from previous providers. -We have advised patient to follow up per instructions below.   Return if symptoms worsen or fail to improve.   Deeann Saint, MD

## 2023-02-02 ENCOUNTER — Other Ambulatory Visit: Payer: Commercial Managed Care - PPO

## 2023-02-02 ENCOUNTER — Ambulatory Visit: Payer: Commercial Managed Care - PPO | Admitting: Family Medicine

## 2023-02-02 DIAGNOSIS — E7849 Other hyperlipidemia: Secondary | ICD-10-CM

## 2023-02-02 DIAGNOSIS — E66813 Obesity, class 3: Secondary | ICD-10-CM

## 2023-02-02 DIAGNOSIS — R7303 Prediabetes: Secondary | ICD-10-CM

## 2023-02-09 ENCOUNTER — Ambulatory Visit: Payer: Commercial Managed Care - PPO | Admitting: Family Medicine

## 2023-04-13 ENCOUNTER — Telehealth: Payer: Commercial Managed Care - PPO | Admitting: Physician Assistant

## 2023-04-13 ENCOUNTER — Ambulatory Visit: Payer: Self-pay | Admitting: Family Medicine

## 2023-04-13 DIAGNOSIS — J208 Acute bronchitis due to other specified organisms: Secondary | ICD-10-CM

## 2023-04-13 DIAGNOSIS — J4541 Moderate persistent asthma with (acute) exacerbation: Secondary | ICD-10-CM | POA: Diagnosis not present

## 2023-04-13 DIAGNOSIS — B9689 Other specified bacterial agents as the cause of diseases classified elsewhere: Secondary | ICD-10-CM | POA: Diagnosis not present

## 2023-04-13 MED ORDER — ALBUTEROL SULFATE HFA 108 (90 BASE) MCG/ACT IN AERS: 1.0000 | INHALATION_SPRAY | Freq: Four times a day (QID) | RESPIRATORY_TRACT | 0 refills | Status: AC | PRN

## 2023-04-13 MED ORDER — BENZONATATE 100 MG PO CAPS: 100.0000 mg | ORAL_CAPSULE | Freq: Three times a day (TID) | ORAL | 0 refills | Status: DC | PRN

## 2023-04-13 MED ORDER — PROMETHAZINE-DM 6.25-15 MG/5ML PO SYRP: 5.0000 mL | ORAL_SOLUTION | Freq: Four times a day (QID) | ORAL | 0 refills | Status: DC | PRN

## 2023-04-13 MED ORDER — PREDNISONE 20 MG PO TABS: 40.0000 mg | ORAL_TABLET | Freq: Every day | ORAL | 0 refills | Status: DC

## 2023-04-13 MED ORDER — AZITHROMYCIN 250 MG PO TABS: ORAL_TABLET | ORAL | 0 refills | Status: AC

## 2023-04-13 NOTE — Patient Instructions (Signed)
 Rudi Rummage, thank you for joining Margaretann Loveless, PA-C for today's virtual visit.  While this provider is not your primary care provider (PCP), if your PCP is located in our provider database this encounter information will be shared with them immediately following your visit.   A [facility] MyChart account gives you access to today's visit and all your visits, tests, and labs performed at [facility] " click here if you don't have a [facility] MyChart account or go to [email or web address]  Consent: (Patient) Laura Ryan provided verbal consent for this virtual visit at the beginning of the encounter.  Current Medications:  Current Outpatient Medications:    albuterol (VENTOLIN HFA) 108 (90 Base) MCG/ACT inhaler, Inhale 1-2 puffs into the lungs every 6 (six) hours as needed., Disp: 8 g, Rfl: 0   azithromycin (ZITHROMAX) 250 MG tablet, Take 2 tablets on day 1, then 1 tablet daily on days 2 through 5, Disp: 6 tablet, Rfl: 0   benzonatate (TESSALON) 100 MG capsule, Take 1-2 capsules (100-200 mg total) by mouth 3 (three) times daily as needed., Disp: 30 capsule, Rfl: 0   predniSONE (DELTASONE) 20 MG tablet, Take 2 tablets (40 mg total) by mouth daily with breakfast., Disp: 14 tablet, Rfl: 0   promethazine-dextromethorphan (PROMETHAZINE-DM) 6.25-15 MG/5ML syrup, Take 5 mLs by mouth 4 (four) times daily as needed., Disp: 118 mL, Rfl: 0   cetirizine (ZYRTEC) 10 MG tablet, Take 1 tablet (10 mg total) by mouth daily., Disp: 90 tablet, Rfl: 0   fluticasone (FLONASE) 50 MCG/ACT nasal spray, Place 1 spray into both nostrils daily., Disp: 16 g, Rfl: 3   metFORMIN (GLUCOPHAGE) 500 MG tablet, Take 1 tablet (500 mg total) by mouth daily., Disp: 90 tablet, Rfl: 1   molnupiravir EUA (LAGEVRIO) 200 MG CAPS capsule, , Disp: , Rfl:    Multiple Vitamin (MULTIVITAMIN) tablet, Take 1 tablet by mouth daily., Disp: , Rfl:    Pediatric Multivitamins-Fl (MULTIVITAMIN + FLUORIDE) 0.25 MG CHEW, ,  Disp: , Rfl:    phentermine 15 MG capsule, Take 15 mg by mouth every morning., Disp: , Rfl:    Vitamin D, Ergocalciferol, (DRISDOL) 1.25 MG (50000 UNIT) CAPS capsule, Take 1 capsule (50,000 Units total) by mouth every 7 (seven) days., Disp: 4 capsule, Rfl: 0   Medications ordered in this encounter:  Meds ordered this encounter  Medications   albuterol (VENTOLIN HFA) 108 (90 Base) MCG/ACT inhaler    Sig: Inhale 1-2 puffs into the lungs every 6 (six) hours as needed.    Dispense:  8 g    Refill:  0    Supervising Provider:   Merrilee Jansky [[id number]]   predniSONE (DELTASONE) 20 MG tablet    Sig: Take 2 tablets (40 mg total) by mouth daily with breakfast.    Dispense:  14 tablet    Refill:  0    Supervising Provider:   Merrilee Jansky [[id number]]   azithromycin (ZITHROMAX) 250 MG tablet    Sig: Take 2 tablets on day 1, then 1 tablet daily on days 2 through 5    Dispense:  6 tablet    Refill:  0    Supervising Provider:   Merrilee Jansky [[id number]]   promethazine-dextromethorphan (PROMETHAZINE-DM) 6.25-15 MG/5ML syrup    Sig: Take 5 mLs by mouth 4 (four) times daily as needed.    Dispense:  118 mL    Refill:  0    Supervising Provider:   Merrilee Jansky [phone]  benzonatate (TESSALON) 100 MG capsule    Sig: Take 1-2 capsules (100-200 mg total) by mouth 3 (three) times daily as needed.    Dispense:  30 capsule    Refill:  0    Supervising Provider:   Merrilee Jansky [[id number]]     *If you need refills on other medications prior to your next appointment, please contact your pharmacy*  Follow-Up: Call back or seek an in-person evaluation if the symptoms worsen or if the condition fails to improve as anticipated.  [facility] [phone]  Other Instructions  Acute Bronchitis, Adult  Acute bronchitis is sudden inflammation of the main airways (bronchi) that come off the windpipe (trachea) in the lungs. The swelling causes the airways to get smaller and  make more mucus than normal. This can make it hard to breathe and can cause coughing or noisy breathing (wheezing). Acute bronchitis may last several weeks. The cough may last longer. Allergies, asthma, and exposure to smoke may make the condition worse. What are the causes? This condition can be caused by germs and by substances that irritate the lungs, including: Cold and flu viruses. The most common cause of this condition is the virus that causes the common cold. Bacteria. This is less common. Breathing in substances that irritate the lungs, including: Smoke from cigarettes and other forms of tobacco. Dust and pollen. Fumes from household cleaning products, gases, or burned fuel. Indoor or outdoor air pollution. What increases the risk? The following factors may make you more likely to develop this condition: A weak body's defense system, also called the immune system. A condition that affects your lungs and breathing, such as asthma. What are the signs or symptoms? Common symptoms of this condition include: Coughing. This may bring up clear, yellow, or green mucus from your lungs (sputum). Wheezing. Runny or stuffy nose. Having too much mucus in your lungs (chest congestion). Shortness of breath. Aches and pains, including sore throat or chest. How is this diagnosed? This condition is usually diagnosed based on: Your symptoms and medical history. A physical exam. You may also have other tests, including tests to rule out other conditions, such as pneumonia. These tests include: A test of lung function. Test of a mucus sample to look for the presence of bacteria. Tests to check the oxygen level in your blood. Blood tests. Chest X-ray. How is this treated? Most cases of acute bronchitis clear up over time without treatment. Your health care provider may recommend: Drinking more fluids to help thin your mucus so it is easier to cough up. Taking inhaled medicine (inhaler) to  improve air flow in and out of your lungs. Using a vaporizer or a humidifier. These are machines that add water to the air to help you breathe better. Taking a medicine that thins mucus and clears congestion (expectorant). Taking a medicine that prevents or stops coughing (cough suppressant). It is not common to take an antibiotic medicine for this condition. Follow these instructions at home:  Take over-the-counter and prescription medicines only as told by your health care provider. Use an inhaler, vaporizer, or humidifier as told by your health care provider. Take two teaspoons (10 mL) of honey at bedtime to lessen coughing at night. Drink enough fluid to keep your urine pale yellow. Do not use any products that contain nicotine or tobacco. These products include cigarettes, chewing tobacco, and vaping devices, such as e-cigarettes. If you need help quitting, ask your health care provider. Get plenty of  rest. Return to your normal activities as told by your health care provider. Ask your health care provider what activities are safe for you. Keep all follow-up visits. This is important. How is this prevented? To lower your risk of getting this condition again: Wash your hands often with soap and water for at least 20 seconds. If soap and water are not available, use hand sanitizer. Avoid contact with people who have cold symptoms. Try not to touch your mouth, nose, or eyes with your hands. Avoid breathing in smoke or chemical fumes. Breathing smoke or chemical fumes will make your condition worse. Get the flu shot every year. Contact a health care provider if: Your symptoms do not improve after 2 weeks. You have trouble coughing up the mucus. Your cough keeps you awake at night. You have a fever. Get help right away if you: Cough up blood. Feel pain in your chest. Have severe shortness of breath. Faint or keep feeling like you are going to faint. Have a severe headache. Have a  fever or chills that get worse. These symptoms may represent a serious problem that is an emergency. Do not wait to see if the symptoms will go away. Get medical help right away. Call your local emergency services (911 in the [place] Do not drive yourself to the hospital. Summary Acute bronchitis is inflammation of the main airways (bronchi) that come off the windpipe (trachea) in the lungs. The swelling causes the airways to get smaller and make more mucus than normal. Drinking more fluids can help thin your mucus so it is easier to cough up. Take over-the-counter and prescription medicines only as told by your health care provider. Do not use any products that contain nicotine or tobacco. These products include cigarettes, chewing tobacco, and vaping devices, such as e-cigarettes. If you need help quitting, ask your health care provider. Contact a health care provider if your symptoms do not improve after 2 weeks. This information is not intended to replace advice given to you by your health care provider. Make sure you discuss any questions you have with your health care provider. Document Revised: 05/16/2021 Document Reviewed: 06/06/2020 Elsevier Patient Education  2024 [employer]  Asthma, Adult  Asthma is a long-term (chronic) condition that causes recurrent episodes in which the lower airways in the lungs become tight and narrow. The narrowing is caused by inflammation and tightening of the smooth muscle around the lower airways. Asthma episodes, also called asthma attacks or asthma flares, may cause coughing, making high-pitched whistling sounds when you breathe, most often when you breathe out (wheezing), shortness of breath, and chest pain. The airways may produce extra mucus caused by the inflammation and irritation. During an attack, it can be difficult to breathe. Asthma attacks can range from minor to life-threatening. Asthma cannot be cured, but medicines and lifestyle changes can  help control it and treat acute attacks. It is important to keep your asthma well controlled so the condition does not interfere with your daily life. What are the causes? This condition is believed to be caused by inherited (genetic) and environmental factors, but its exact cause is not known. What can trigger an asthma attack? Many things can bring on an asthma attack or make symptoms worse. These triggers are different for every person. Common triggers include: Allergens and irritants like mold, dust, pet dander, cockroaches, pollen, air pollution, and chemical odors. Cigarette smoke. Weather changes and cold air. Stress and strong emotional responses such as crying or laughing  hard. Certain medications such as aspirin or beta blockers. Infections and inflammatory conditions, such as the flu, a cold, pneumonia, or inflammation of the nasal membranes (rhinitis). Gastroesophageal reflux disease (GERD). What are the signs or symptoms? Symptoms may occur right after exposure to an asthma trigger or hours later and can vary by person. Common signs and symptoms include: Wheezing. Trouble breathing (shortness of breath). Excessive nighttime or early morning coughing. Chest tightness. Tiredness (fatigue) with minimal activity. Difficulty talking in complete sentences. Poor exercise tolerance. How is this diagnosed? This condition is diagnosed based on: A physical exam and your medical history. Tests, which may include: Lung function studies to evaluate the flow of air in your lungs. Allergy tests. Imaging tests, such as X-rays. How is this treated? There is no cure, but symptoms can be controlled with proper treatment. Treatment usually involves: Identifying and avoiding your asthma triggers. Inhaled medicines. Two types are commonly used to treat asthma, depending on severity: Controller medicines. These help prevent asthma symptoms from occurring. They are taken every day. Fast-acting  reliever or rescue medicines. These quickly relieve asthma symptoms. They are used as needed and provide short-term relief. Using other medicines, such as: Allergy medicines, such as antihistamines, if your asthma attacks are triggered by allergens. Immune medicines (immunomodulators). These are medicines that help control the immune system. Using supplemental oxygen. This is only needed during a severe episode. Creating an asthma action plan. An asthma action plan is a written plan for managing and treating your asthma attacks. This plan includes: A list of your asthma triggers and how to avoid them. Information about when medicines should be taken and when their dosage should be changed. Instructions about using a device called a peak flow meter. A peak flow meter measures how well the lungs are working and the severity of your asthma. It helps you monitor your condition. Follow these instructions at home: Take over-the-counter and prescription medicines only as told by your health care provider. Stay up to date on all vaccinations as recommended by your healthcare provider, including vaccines for the flu and pneumonia. Use a peak flow meter and keep track of your peak flow readings. Understand and use your asthma action plan to address any asthma flares. Do not smoke or allow anyone to smoke in your home. Contact a health care provider if: You have wheezing, shortness of breath, or a cough that is not responding to medicines. Your medicines are causing side effects, such as a rash, itching, swelling, or trouble breathing. You need to use a reliever medicine more than 2-3 times a week. Your peak flow reading is still at 50-79% of your personal best after following your action plan for 1 hour. You have a fever and shortness of breath. Get help right away if: You are getting worse and do not respond to treatment during an asthma attack. You are short of breath when at rest or when doing very  little physical activity. You have difficulty eating, drinking, or talking. You have chest pain or tightness. You develop a fast heartbeat or palpitations. You have a bluish color to your lips or fingernails. You are light-headed or dizzy, or you faint. Your peak flow reading is less than 50% of your personal best. You feel too tired to breathe normally. These symptoms may be an emergency. Get help right away. Call 911. Do not wait to see if the symptoms will go away. Do not drive yourself to the hospital. Summary Asthma is a long-term (chronic)  condition that causes recurrent episodes in which the airways become tight and narrow. Asthma episodes, also called asthma attacks or asthma flares, can cause coughing, wheezing, shortness of breath, and chest pain. Asthma cannot be cured, but medicines and lifestyle changes can help keep it well controlled and prevent asthma flares. Make sure you understand how to avoid triggers and how and when to use your medicines. Asthma attacks can range from minor to life-threatening. Get help right away if you have an asthma attack and do not respond to treatment with your usual rescue medicines. This information is not intended to replace advice given to you by your health care provider. Make sure you discuss any questions you have with your health care provider. Document Revised: 11/21/2020 Document Reviewed: 11/12/2020 Elsevier Patient Education  2024 Else[employer] Inc.   If you have been instructed to have an in-person evaluation today at a local Urgent Care facility, please use the link below. It will take you to a list of all of our available [facility], including address, phone number and hours of operation. Please do not delay care.  [facility]  If you or a family member do not have a primary care provider, use the link below to schedule a visit and establish care. When you choose a [facility] primary care physician or advanced  practice provider, you gain a long-term partner in health. Find a Primary Care Provider  Learn more about [place]'s in-office and virtual care options: [facility] Get Care Now

## 2023-04-13 NOTE — Telephone Encounter (Signed)
 Copied from CRM [id number]. Topic: Clinical - Red Word Triage >> Apr 13, 2023 10:10 AM Denese Killings wrote: [facility] that prompted transfer to Nurse Triage: Patient is experiencing symptoms of cold, wheezing, fatigue, coughing, no appetitie, hot/cold feeling, hurts from chest to stomach while coughing.  Chief Complaint: cough- yellowish brown phlegm Symptoms: wheezing, fatigue, no appetite, not/cold feeling,  Frequency: Sat Pertinent Negatives: Patient denies SOB, fever Disposition: [] ED /[] Urgent Care (no appt availability in office) / [] Appointment(In office/virtual)/ [x]  [facility]/ [] Home Care/ [] Refused Recommended Disposition /[] [facility]/ []  Follow-up with PCP Additional Notes: no appts in office until tomorrow  Reason for Disposition  Wheezing is present  Answer Assessment - Initial Assessment Questions 1. ONSET: "When did the cough begin?"      Sat 2. SEVERITY: "How bad is the cough today?"      Very frequent cough 3. SPUTUM: "Describe the color of your sputum" (none, dry cough; clear, white, yellow, green)     Yellowish brown  4. HEMOPTYSIS: "Are you coughing up any blood?" If so ask: "How much?" (flecks, streaks, tablespoons, etc.)     no 5. DIFFICULTY BREATHING: "Are you having difficulty breathing?" If Yes, ask: "How bad is it?" (e.g., mild, moderate, severe)    - MILD: No SOB at rest, mild SOB with walking, speaks normally in sentences, can lie down, no retractions, pulse < 100.    - MODERATE: SOB at rest, SOB with minimal exertion and prefers to sit, cannot lie down flat, speaks in phrases, mild retractions, audible wheezing, pulse 100-120.    - SEVERE: Very SOB at rest, speaks in single words, struggling to breathe, sitting hunched forward, retractions, pulse > 120      no 6. FEVER: "Do you have a fever?" If Yes, ask: "What is your temperature, how was it measured, and when did it start?"     no 7. CARDIAC HISTORY: "Do you have any history of heart  disease?" (e.g., heart attack, congestive heart failure)      no 8. LUNG HISTORY: "Do you have any history of lung disease?"  (e.g., pulmonary embolus, asthma, emphysema)     asthma 9. PE RISK FACTORS: "Do you have a history of blood clots?" (or: recent major surgery, recent prolonged travel, bedridden)     N/a 10. OTHER SYMPTOMS: "Do you have any other symptoms?" (e.g., runny nose, wheezing, chest pain)       Runny nose, wheezing, fatigue, poor appetite, chest and abd sore from coughing  11. PREGNANCY: "Is there any chance you are pregnant?" "When was your last menstrual period?"       N/a 12. TRAVEL: "Have you traveled out of the country in the last month?" (e.g., travel history, exposures)       N/a  Protocols used: Cough - Acute Productive-A-AH

## 2023-04-13 NOTE — Progress Notes (Signed)
 Virtual Visit Consent   Laura Ryan, you are scheduled for a virtual visit with a [facility] provider today. Just as with appointments in the office, your consent must be obtained to participate. Your consent will be active for this visit and any virtual visit you may have with one of our providers in the next 365 days. If you have a MyChart account, a copy of this consent can be sent to you electronically.  As this is a virtual visit, video technology does not allow for your provider to perform a traditional examination. This may limit your provider's ability to fully assess your condition. If your provider identifies any concerns that need to be evaluated in person or the need to arrange testing (such as labs, EKG, etc.), we will make arrangements to do so. Although advances in technology are sophisticated, we cannot ensure that it will always work on either your end or our end. If the connection with a video visit is poor, the visit may have to be switched to a telephone visit. With either a video or telephone visit, we are not always able to ensure that we have a secure connection.  By engaging in this virtual visit, you consent to the provision of healthcare and authorize for your insurance to be billed (if applicable) for the services provided during this visit. Depending on your insurance coverage, you may receive a charge related to this service.  I need to obtain your verbal consent now. Are you willing to proceed with your visit today? Laura Ryan has provided verbal consent on 04/13/2023 for a virtual visit (video or telephone). Margaretann Loveless, PA-C  Date: 04/13/2023 2:52 PM   Virtual Visit via Video Note   I, Margaretann Loveless, connected with  Laura Ryan  ([id number], [birth year]) on 04/13/23 at  2:45 PM EST by a video-enabled telemedicine application and verified that I am speaking with the correct person using two identifiers.  Location: Patient: Virtual Visit Location  Patient: Home Provider: Virtual Visit Location Provider: Home Office   I discussed the limitations of evaluation and management by telemedicine and the availability of in person appointments. The patient expressed understanding and agreed to proceed.    History of Present Illness: Laura Ryan is a 58 y.o. who identifies as a female who was assigned female at birth, and is being seen today for cough and congestion.  HPI: URI  This is a new problem. The current episode started in the past 7 days (Symptoms started on Saturday, 04/11/23). The problem has been gradually worsening. There has been no fever. Associated symptoms include chest pain, congestion, coughing, headaches (slight), rhinorrhea (and post nasal drainage), vomiting (from coughing) and wheezing. Pertinent negatives include no diarrhea, ear pain, nausea, plugged ear sensation, sinus pain or sore throat. Associated symptoms comments: Chills, no appetite, no energy. She has tried inhaler use for the symptoms. The treatment provided no relief.   PMH: Asthma   Problems:  Patient Active Problem List   Diagnosis Date Noted   History of colonic polyps 12/10/2022   Family history of malignant neoplasm of digestive organs 12/10/2022   Elevated blood pressure reading 10/27/2022   Physical exam, annual 10/27/2022   Moderate mixed hyperlipidemia not requiring statin therapy 10/27/2022   Other hyperlipidemia 11/04/2021   Pre-diabetes 10/07/2021   Elevated cholesterol 10/07/2021   Class 3 severe obesity with serious comorbidity and body mass index (BMI) of 45.0 to 49.9 in adult [facility]) 10/07/2021   Iron deficiency anemia 05/02/2021  BMI 40.0-44.9, adult (HCC) 08/13/2020   Asthma 06/27/2016   Morbid obesity (HCC) 06/27/2016   Vitamin D deficiency 03/07/2013    Allergies: No Known Allergies Medications:  Current Outpatient Medications:    albuterol (VENTOLIN HFA) 108 (90 Base) MCG/ACT inhaler, Inhale 1-2 puffs into the lungs every 6 (six)  hours as needed., Disp: 8 g, Rfl: 0   azithromycin (ZITHROMAX) 250 MG tablet, Take 2 tablets on day 1, then 1 tablet daily on days 2 through 5, Disp: 6 tablet, Rfl: 0   benzonatate (TESSALON) 100 MG capsule, Take 1-2 capsules (100-200 mg total) by mouth 3 (three) times daily as needed., Disp: 30 capsule, Rfl: 0   predniSONE (DELTASONE) 20 MG tablet, Take 2 tablets (40 mg total) by mouth daily with breakfast., Disp: 14 tablet, Rfl: 0   promethazine-dextromethorphan (PROMETHAZINE-DM) 6.25-15 MG/5ML syrup, Take 5 mLs by mouth 4 (four) times daily as needed., Disp: 118 mL, Rfl: 0   cetirizine (ZYRTEC) 10 MG tablet, Take 1 tablet (10 mg total) by mouth daily., Disp: 90 tablet, Rfl: 0   fluticasone (FLONASE) 50 MCG/ACT nasal spray, Place 1 spray into both nostrils daily., Disp: 16 g, Rfl: 3   metFORMIN (GLUCOPHAGE) 500 MG tablet, Take 1 tablet (500 mg total) by mouth daily., Disp: 90 tablet, Rfl: 1   molnupiravir EUA (LAGEVRIO) 200 MG CAPS capsule, , Disp: , Rfl:    Multiple Vitamin (MULTIVITAMIN) tablet, Take 1 tablet by mouth daily., Disp: , Rfl:    Pediatric Multivitamins-Fl (MULTIVITAMIN + FLUORIDE) 0.25 MG CHEW, , Disp: , Rfl:    phentermine 15 MG capsule, Take 15 mg by mouth every morning., Disp: , Rfl:    Vitamin D, Ergocalciferol, (DRISDOL) 1.25 MG (50000 UNIT) CAPS capsule, Take 1 capsule (50,000 Units total) by mouth every 7 (seven) days., Disp: 4 capsule, Rfl: 0  Observations/Objective: Patient is well-developed, well-nourished in no acute distress.  Resting comfortably at home.  Head is normocephalic, atraumatic.  No labored breathing.  Speech is clear and coherent with logical content.  Patient is alert and oriented at baseline.    Assessment and Plan: 1. Moderate persistent asthma with exacerbation (Primary) - albuterol (VENTOLIN HFA) 108 (90 Base) MCG/ACT inhaler; Inhale 1-2 puffs into the lungs every 6 (six) hours as needed.  Dispense: 8 g; Refill: 0 - predniSONE (DELTASONE) 20 MG  tablet; Take 2 tablets (40 mg total) by mouth daily with breakfast.  Dispense: 14 tablet; Refill: 0 - azithromycin (ZITHROMAX) 250 MG tablet; Take 2 tablets on day 1, then 1 tablet daily on days 2 through 5  Dispense: 6 tablet; Refill: 0 - promethazine-dextromethorphan (PROMETHAZINE-DM) 6.25-15 MG/5ML syrup; Take 5 mLs by mouth 4 (four) times daily as needed.  Dispense: 118 mL; Refill: 0 - benzonatate (TESSALON) 100 MG capsule; Take 1-2 capsules (100-200 mg total) by mouth 3 (three) times daily as needed.  Dispense: 30 capsule; Refill: 0  2. Acute bacterial bronchitis - azithromycin (ZITHROMAX) 250 MG tablet; Take 2 tablets on day 1, then 1 tablet daily on days 2 through 5  Dispense: 6 tablet; Refill: 0  - Worsening over a week despite OTC medications - Will treat with Z-pack and Prednisone - Tessalon perles and Promethazine DM for cough - Refilled albuterol - Can continue Mucinex  - Push fluids.  - Rest.  - Steam and humidifier can help - Seek in person evaluation if worsening or symptoms fail to improve    Follow Up Instructions: I discussed the assessment and treatment plan with the patient.  The patient was provided an opportunity to ask questions and all were answered. The patient agreed with the plan and demonstrated an understanding of the instructions.  A copy of instructions were sent to the patient via MyChart unless otherwise noted below.    The patient was advised to call back or seek an in-person evaluation if the symptoms worsen or if the condition fails to improve as anticipated.    Margaretann Loveless, PA-C

## 2023-11-11 ENCOUNTER — Encounter: Payer: Self-pay | Admitting: Family Medicine

## 2023-11-11 ENCOUNTER — Ambulatory Visit (INDEPENDENT_AMBULATORY_CARE_PROVIDER_SITE_OTHER): Admitting: Family Medicine

## 2023-11-11 VITALS — BP 118/82 | HR 80 | Temp 98.8°F | Ht 61.18 in | Wt 221.6 lb

## 2023-11-11 DIAGNOSIS — E66813 Obesity, class 3: Secondary | ICD-10-CM

## 2023-11-11 DIAGNOSIS — R7303 Prediabetes: Secondary | ICD-10-CM | POA: Diagnosis not present

## 2023-11-11 DIAGNOSIS — E7849 Other hyperlipidemia: Secondary | ICD-10-CM

## 2023-11-11 DIAGNOSIS — E559 Vitamin D deficiency, unspecified: Secondary | ICD-10-CM

## 2023-11-11 DIAGNOSIS — Z0000 Encounter for general adult medical examination without abnormal findings: Secondary | ICD-10-CM

## 2023-11-11 DIAGNOSIS — Z6841 Body Mass Index (BMI) 40.0 and over, adult: Secondary | ICD-10-CM

## 2023-11-11 DIAGNOSIS — R7989 Other specified abnormal findings of blood chemistry: Secondary | ICD-10-CM

## 2023-11-11 DIAGNOSIS — H6123 Impacted cerumen, bilateral: Secondary | ICD-10-CM

## 2023-11-11 NOTE — Progress Notes (Signed)
 Established Patient Office Visit   Subjective  Patient ID: Laura Ryan, female    DOB: 07-Mar-1965  Age: 58 y.o. MRN: [id number]  Chief Complaint  Patient presents with   Annual Exam    Pt is a 58 yo female seen for CPE.  Pt states she is doing ok.  Has h/o preDM. Started semaglutide  1.25 mg wkly, 3 wks ago for wt loss.  Paying out of pocket.  Endorses increased nausea and 1 episode of emesis x 2 wks.  Did not take dose on Monday due to the symptoms.  Prior to starting medication pt weighed 245 lbs, currently 221.6 lbs.   Patient Active Problem List   Diagnosis Date Noted   History of colonic polyps 12/10/2022   Family history of malignant neoplasm of digestive organs 12/10/2022   Elevated blood pressure reading 10/27/2022   Physical exam, annual 10/27/2022   Moderate mixed hyperlipidemia not requiring statin therapy 10/27/2022   Other hyperlipidemia 11/04/2021   Pre-diabetes 10/07/2021   Elevated cholesterol 10/07/2021   Class 3 severe obesity with serious comorbidity and body mass index (BMI) of 45.0 to 49.9 in adult 10/07/2021   Iron deficiency anemia 05/02/2021   BMI 40.0-44.9, adult (HCC) 08/13/2020   Asthma 06/27/2016   Morbid obesity (HCC) 06/27/2016   Vitamin D  deficiency 03/07/2013   Past Medical History:  Diagnosis Date   Asthma    Asthma    Bronchitis    Edema, lower extremity    High cholesterol    Obese    Past Surgical History:  Procedure Laterality Date   CESAREAN SECTION  01/03/94   TUBAL LIGATION  01/03/94   Social History   Tobacco Use   Smoking status: Never   Smokeless tobacco: Never  Substance Use Topics   Alcohol use: Yes    Comment: socially   Drug use: No   Family History  Problem Relation Age of Onset   Cancer Mother 28       Colon Cancer   Early death Mother    Alcohol abuse Father    Early death Father    No Known Allergies  ROS Negative unless stated above    Objective:     BP 118/82 (BP Location: Left Arm, Patient  Position: Sitting, Cuff Size: Large)   Pulse 80   Temp 98.8 F (37.1 C) (Oral)   Ht 5' 1.18 (1.554 m)   Wt 221 lb 9.6 oz (100.5 kg)   LMP  (LMP Unknown)   SpO2 99%   BMI 41.62 kg/m  BP Readings from Last 3 Encounters:  11/11/23 118/82  12/11/22 122/84  11/10/22 130/82   Wt Readings from Last 3 Encounters:  11/11/23 221 lb 9.6 oz (100.5 kg)  12/11/22 226 lb 12.8 oz (102.9 kg)  11/10/22 228 lb (103.4 kg)      Physical Exam Constitutional:      Appearance: Normal appearance.  HENT:     Head: Normocephalic and atraumatic.     Right Ear: Hearing, tympanic membrane, ear canal and external ear normal. There is impacted cerumen.     Left Ear: Hearing, ear canal and external ear normal. There is impacted cerumen.     Ears:     Comments: Cerumen in b/l canals partially obstructing view of TMs    Nose: Nose normal.     Mouth/Throat:     Mouth: Mucous membranes are moist.     Pharynx: No oropharyngeal exudate or posterior oropharyngeal erythema.  Eyes:  General: No scleral icterus.    Extraocular Movements: Extraocular movements intact.     Conjunctiva/sclera: Conjunctivae normal.     Pupils: Pupils are equal, round, and reactive to light.  Neck:     Thyroid: No thyromegaly.     Vascular: No carotid bruit.  Cardiovascular:     Rate and Rhythm: Normal rate and regular rhythm.     Pulses: Normal pulses.     Heart sounds: Normal heart sounds. No murmur heard.    No friction rub.  Pulmonary:     Effort: Pulmonary effort is normal.     Breath sounds: Normal breath sounds. No wheezing, rhonchi or rales.  Abdominal:     General: Bowel sounds are normal.     Palpations: Abdomen is soft.     Tenderness: There is no abdominal tenderness.  Musculoskeletal:        General: No deformity. Normal range of motion.  Lymphadenopathy:     Cervical: No cervical adenopathy.  Skin:    General: Skin is warm and dry.     Findings: No lesion.  Neurological:     General: No focal deficit  present.     Mental Status: She is alert and oriented to person, place, and time.  Psychiatric:        Mood and Affect: Mood normal.        Thought Content: Thought content normal.        11/11/2023    5:08 PM 12/11/2022   10:33 AM 10/27/2022    4:45 PM  Depression screen PHQ 2/9  Decreased Interest 0 0 0  Down, Depressed, Hopeless 0 0 0  PHQ - 2 Score 0 0 0  Altered sleeping 0 0 0  Tired, decreased energy 1 0 0  Change in appetite 1 1 0  Feeling bad or failure about yourself  0 0 0  Trouble concentrating 0 0 0  Moving slowly or fidgety/restless 0 0 0  Suicidal thoughts 0 0 0  PHQ-9 Score 2 1 0  Difficult doing work/chores  Not difficult at all       11/11/2023    5:09 PM 12/11/2022   10:34 AM 10/27/2022    4:46 PM 08/13/2020   10:05 AM  GAD 7 : Generalized Anxiety Score  Nervous, Anxious, on Edge 0 0 0 0  Control/stop worrying 0 0 0 0  Worry too much - different things 0 0 0 0  Trouble relaxing 0 0 0 0  Restless 0 0 0 0  Easily annoyed or irritable 0 0 0 0  Afraid - awful might happen 0 0 0 0  Total GAD 7 Score 0 0 0 0  Anxiety Difficulty  Not difficult at all  Not difficult at all     No results found for any visits on 11/11/23.    Assessment & Plan:   Well adult exam -     CBC with Differential/Platelet; Future -     Comprehensive metabolic panel with GFR; Future -     Hemoglobin A1c; Future -     Lipid panel; Future -     T4, free; Future -     TSH; Future  Pre-diabetes -     Hemoglobin A1c; Future  Vitamin D  deficiency -     VITAMIN D  25 Hydroxy (Vit-D Deficiency, Fractures); Future  Other hyperlipidemia -     Lipid panel; Future  Class 3 severe obesity with serious comorbidity and body mass index (BMI) of 40.0 to  44.9 in adult, unspecified obesity type -     Vitamin B12; Future  Bilateral impacted cerumen  Elevated serum creatinine -     Basic metabolic panel with GFR  Age-appropriate health screenings discussed.  Obtain labs at patient's  convenience.  Orders placed.  Immunizations reviewed.  Patient to obtain influenza and Tdap through her job.  Mammogram done this year.  Obtain records to update.  Colonoscopy done 11/18/21.  Pap up to date done 08/14/23.    OTC debrox ear gtts.  Return if symptoms worsen or fail to improve.   [name], MD

## 2023-11-12 LAB — COMPREHENSIVE METABOLIC PANEL WITH GFR
ALT: 17 U/L (ref 0–35)
AST: 15 U/L (ref 0–37)
Albumin: 4.3 g/dL (ref 3.5–5.2)
Alkaline Phosphatase: 63 U/L (ref 39–117)
BUN: 19 mg/dL (ref 6–23)
CO2: 28 meq/L (ref 19–32)
Calcium: 9.5 mg/dL (ref 8.4–10.5)
Chloride: 96 meq/L (ref 96–112)
Creatinine, Ser: 1.21 mg/dL — ABNORMAL HIGH (ref 0.40–1.20)
GFR: 49.64 mL/min — ABNORMAL LOW (ref >60.00)
Glucose, Bld: 89 mg/dL (ref 70–99)
Potassium: 3.6 meq/L (ref 3.5–5.1)
Sodium: 135 meq/L (ref 135–145)
Total Bilirubin: 0.7 mg/dL (ref 0.2–1.2)
Total Protein: 7.3 g/dL (ref 6.0–8.3)

## 2023-11-12 LAB — CBC WITH DIFFERENTIAL/PLATELET
Basophils Absolute: 0 K/uL (ref 0.0–0.1)
Basophils Relative: 0.2 % (ref 0.0–3.0)
Eosinophils Absolute: 0.3 K/uL (ref 0.0–0.7)
Eosinophils Relative: 5.4 % — ABNORMAL HIGH (ref 0.0–5.0)
HCT: 40.9 % (ref 36.0–46.0)
Hemoglobin: 13.8 g/dL (ref 12.0–15.0)
Lymphocytes Relative: 27.1 % (ref 12.0–46.0)
Lymphs Abs: 1.5 K/uL (ref 0.7–4.0)
MCHC: 33.7 g/dL (ref 30.0–36.0)
MCV: 87.1 fl (ref 78.0–100.0)
Monocytes Absolute: 0.8 K/uL (ref 0.1–1.0)
Monocytes Relative: 13.3 % — ABNORMAL HIGH (ref 3.0–12.0)
Neutro Abs: 3.1 K/uL (ref 1.4–7.7)
Neutrophils Relative %: 54 % (ref 43.0–77.0)
Platelets: 190 K/uL (ref 150.0–400.0)
RBC: 4.7 Mil/uL (ref 3.87–5.11)
RDW: 14.4 % (ref 11.5–15.5)
WBC: 5.7 K/uL (ref 4.0–10.5)

## 2023-11-12 LAB — LIPID PANEL
Cholesterol: 140 mg/dL (ref 0–200)
HDL: 39.4 mg/dL (ref >39.00)
LDL Cholesterol: 86 mg/dL (ref 0–99)
NonHDL: 100.11
Total CHOL/HDL Ratio: 4
Triglycerides: 72 mg/dL (ref 0.0–149.0)
VLDL: 14.4 mg/dL (ref 0.0–40.0)

## 2023-11-12 LAB — BASIC METABOLIC PANEL WITH GFR
BUN: 19 mg/dL (ref 6–23)
CO2: 28 meq/L (ref 19–32)
Calcium: 9.5 mg/dL (ref 8.4–10.5)
Chloride: 96 meq/L (ref 96–112)
Creatinine, Ser: 1.21 mg/dL — ABNORMAL HIGH (ref 0.40–1.20)
GFR: 49.64 mL/min — ABNORMAL LOW (ref >60.00)
Glucose, Bld: 89 mg/dL (ref 70–99)
Potassium: 3.6 meq/L (ref 3.5–5.1)
Sodium: 135 meq/L (ref 135–145)

## 2023-11-12 LAB — TSH: TSH: 3.63 u[IU]/mL (ref 0.35–5.50)

## 2023-11-12 LAB — VITAMIN B12: Vitamin B-12: 959 pg/mL — ABNORMAL HIGH (ref 211–911)

## 2023-11-12 LAB — HEMOGLOBIN A1C: Hgb A1c MFr Bld: 6.3 % (ref 4.6–6.5)

## 2023-11-12 LAB — VITAMIN D 25 HYDROXY (VIT D DEFICIENCY, FRACTURES): VITD: 21.12 ng/mL — ABNORMAL LOW (ref 30.00–100.00)

## 2023-11-12 LAB — T4, FREE: Free T4: 0.86 ng/dL (ref 0.60–1.60)

## 2023-11-13 ENCOUNTER — Ambulatory Visit: Payer: Self-pay | Admitting: Family Medicine

## 2023-11-13 DIAGNOSIS — E559 Vitamin D deficiency, unspecified: Secondary | ICD-10-CM

## 2023-11-13 MED ORDER — VITAMIN D (ERGOCALCIFEROL) 1.25 MG (50000 UNIT) PO CAPS: 50000.0000 [IU] | ORAL_CAPSULE | ORAL | 0 refills | Status: AC

## 2023-12-11 ENCOUNTER — Ambulatory Visit (INDEPENDENT_AMBULATORY_CARE_PROVIDER_SITE_OTHER)

## 2023-12-11 DIAGNOSIS — Z23 Encounter for immunization: Secondary | ICD-10-CM

## 2023-12-11 NOTE — Progress Notes (Signed)
Per orders of Dr. Salomon Fick , injection of Flu and Tdap given by Stann Ore. Patient tolerated injection well.
